# Patient Record
Sex: Female | Born: 1990
Health system: Southern US, Community
[De-identification: ages and names within clinical notes are randomized; demographics above are authoritative.]

## PROBLEM LIST (undated history)

## (undated) ENCOUNTER — Inpatient Hospital Stay (HOSPITAL_COMMUNITY): Payer: Self-pay

## (undated) DIAGNOSIS — F419 Anxiety disorder, unspecified: Secondary | ICD-10-CM

## (undated) DIAGNOSIS — R87629 Unspecified abnormal cytological findings in specimens from vagina: Secondary | ICD-10-CM

## (undated) DIAGNOSIS — N83209 Unspecified ovarian cyst, unspecified side: Secondary | ICD-10-CM

## (undated) DIAGNOSIS — A749 Chlamydial infection, unspecified: Secondary | ICD-10-CM

## (undated) DIAGNOSIS — K219 Gastro-esophageal reflux disease without esophagitis: Secondary | ICD-10-CM

## (undated) SURGERY — Surgical Case
Anesthesia: *Unknown

---
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
## 2009-03-29 ENCOUNTER — Emergency Department (HOSPITAL_COMMUNITY): Admission: EM | Admit: 2009-03-29 | Discharge: 2009-03-29 | Payer: Self-pay | Admitting: Emergency Medicine

## 2009-07-31 ENCOUNTER — Emergency Department (HOSPITAL_COMMUNITY): Admission: EM | Admit: 2009-07-31 | Discharge: 2009-07-31 | Payer: Self-pay | Admitting: Emergency Medicine

## 2010-05-02 ENCOUNTER — Emergency Department (HOSPITAL_COMMUNITY)
Admission: EM | Admit: 2010-05-02 | Discharge: 2010-05-03 | Payer: Self-pay | Source: Home / Self Care | Admitting: Emergency Medicine

## 2010-08-15 LAB — POCT PREGNANCY, URINE: Preg Test, Ur: NEGATIVE

## 2010-08-23 LAB — URINE MICROSCOPIC-ADD ON

## 2010-08-23 LAB — URINALYSIS, ROUTINE W REFLEX MICROSCOPIC
Ketones, ur: NEGATIVE mg/dL
Nitrite: NEGATIVE
Specific Gravity, Urine: 1.027 (ref 1.005–1.030)
Urobilinogen, UA: 1 mg/dL (ref 0.0–1.0)
pH: 5.5 (ref 5.0–8.0)

## 2010-08-23 LAB — DIFFERENTIAL
Basophils Relative: 0 % (ref 0–1)
Eosinophils Absolute: 0.1 10*3/uL (ref 0.0–0.7)
Eosinophils Relative: 1 % (ref 0–5)
Lymphs Abs: 0.9 10*3/uL (ref 0.7–4.0)
Monocytes Absolute: 0.3 10*3/uL (ref 0.1–1.0)
Monocytes Relative: 6 % (ref 3–12)

## 2010-08-23 LAB — CBC
HCT: 37.5 % (ref 36.0–46.0)
MCHC: 33.6 g/dL (ref 30.0–36.0)
MCV: 89.2 fL (ref 78.0–100.0)
RBC: 4.2 MIL/uL (ref 3.87–5.11)
WBC: 6.1 10*3/uL (ref 4.0–10.5)

## 2010-09-07 LAB — URINALYSIS, ROUTINE W REFLEX MICROSCOPIC
Bilirubin Urine: NEGATIVE
Glucose, UA: NEGATIVE mg/dL
Ketones, ur: NEGATIVE mg/dL
Protein, ur: NEGATIVE mg/dL
pH: 6.5 (ref 5.0–8.0)

## 2010-09-07 LAB — URINE CULTURE

## 2010-09-07 LAB — WET PREP, GENITAL: Yeast Wet Prep HPF POC: NONE SEEN

## 2010-09-07 LAB — URINE MICROSCOPIC-ADD ON

## 2010-09-07 LAB — POCT PREGNANCY, URINE: Preg Test, Ur: NEGATIVE

## 2010-09-12 LAB — URINE MICROSCOPIC-ADD ON

## 2010-09-12 LAB — POCT PREGNANCY, URINE: Preg Test, Ur: NEGATIVE

## 2010-09-12 LAB — URINALYSIS, ROUTINE W REFLEX MICROSCOPIC
Ketones, ur: 15 mg/dL — AB
Nitrite: NEGATIVE
Protein, ur: 30 mg/dL — AB
pH: 7 (ref 5.0–8.0)

## 2010-12-15 ENCOUNTER — Emergency Department (HOSPITAL_COMMUNITY): Payer: Self-pay

## 2010-12-15 ENCOUNTER — Emergency Department (HOSPITAL_COMMUNITY)
Admission: EM | Admit: 2010-12-15 | Discharge: 2010-12-15 | Disposition: A | Discharge status: Home / Self Care | Payer: Self-pay | Attending: Emergency Medicine | Admitting: Emergency Medicine

## 2010-12-15 DIAGNOSIS — R1013 Epigastric pain: Secondary | ICD-10-CM | POA: Insufficient documentation

## 2010-12-15 DIAGNOSIS — K5900 Constipation, unspecified: Secondary | ICD-10-CM | POA: Insufficient documentation

## 2010-12-15 DIAGNOSIS — K219 Gastro-esophageal reflux disease without esophagitis: Secondary | ICD-10-CM | POA: Insufficient documentation

## 2010-12-15 LAB — LIPASE, BLOOD: Lipase: 39 U/L (ref 11–59)

## 2010-12-15 LAB — URINALYSIS, ROUTINE W REFLEX MICROSCOPIC
Bilirubin Urine: NEGATIVE
Glucose, UA: NEGATIVE mg/dL
Hgb urine dipstick: NEGATIVE
Ketones, ur: NEGATIVE mg/dL
Nitrite: NEGATIVE
Protein, ur: NEGATIVE mg/dL
Specific Gravity, Urine: 1.008 (ref 1.005–1.030)
Urobilinogen, UA: 1 mg/dL (ref 0.0–1.0)
pH: 6 (ref 5.0–8.0)

## 2010-12-15 LAB — COMPREHENSIVE METABOLIC PANEL WITH GFR
ALT: 8 U/L (ref 0–35)
AST: 17 U/L (ref 0–37)
Albumin: 3.9 g/dL (ref 3.5–5.2)
Alkaline Phosphatase: 36 U/L — ABNORMAL LOW (ref 39–117)
CO2: 26 meq/L (ref 19–32)
Chloride: 103 meq/L (ref 96–112)
GFR calc non Af Amer: >60 mL/min (ref >60)
Potassium: 4 meq/L (ref 3.5–5.1)
Sodium: 135 meq/L (ref 135–145)
Total Bilirubin: 0.3 mg/dL (ref 0.3–1.2)

## 2010-12-15 LAB — URINE MICROSCOPIC-ADD ON

## 2010-12-15 LAB — COMPREHENSIVE METABOLIC PANEL
BUN: 7 mg/dL (ref 6–23)
Calcium: 9.8 mg/dL (ref 8.4–10.5)
Creatinine, Ser: 0.56 mg/dL (ref 0.50–1.10)
GFR calc Af Amer: >60 mL/min (ref >60)
Glucose, Bld: 81 mg/dL (ref 70–99)
Total Protein: 7.8 g/dL (ref 6.0–8.3)

## 2010-12-15 LAB — POCT PREGNANCY, URINE: Preg Test, Ur: NEGATIVE

## 2010-12-17 LAB — URINE CULTURE
Colony Count: NO GROWTH
Culture  Setup Time: 201207140046
Culture: NO GROWTH

## 2011-02-27 LAB — URINE MICROSCOPIC-ADD ON

## 2011-02-27 LAB — URINALYSIS, ROUTINE W REFLEX MICROSCOPIC
Glucose, UA: NEGATIVE
Hgb urine dipstick: NEGATIVE
Ketones, ur: NEGATIVE
Protein, ur: NEGATIVE

## 2011-03-01 LAB — WET PREP, GENITAL
Trich, Wet Prep: NONE SEEN
Yeast Wet Prep HPF POC: NONE SEEN

## 2011-03-01 LAB — URINE MICROSCOPIC-ADD ON

## 2011-03-01 LAB — URINALYSIS, ROUTINE W REFLEX MICROSCOPIC
Glucose, UA: NEGATIVE
Ketones, ur: 15 — AB
Protein, ur: NEGATIVE

## 2011-03-01 LAB — POCT PREGNANCY, URINE: Operator id: 29452

## 2011-03-05 LAB — URINALYSIS, ROUTINE W REFLEX MICROSCOPIC
Glucose, UA: NEGATIVE
Hgb urine dipstick: NEGATIVE
Ketones, ur: NEGATIVE
Nitrite: NEGATIVE
pH: 7

## 2011-03-05 LAB — CBC
HCT: 31 — ABNORMAL LOW
Hemoglobin: 10.3 — ABNORMAL LOW
RBC: 3.64 — ABNORMAL LOW
WBC: 5.4

## 2011-03-05 LAB — COMPREHENSIVE METABOLIC PANEL
Alkaline Phosphatase: 51
BUN: 4 — ABNORMAL LOW
CO2: 26
Chloride: 105
Glucose, Bld: 90
Potassium: 3.6
Total Bilirubin: 0.4

## 2011-03-05 LAB — DIFFERENTIAL
Basophils Absolute: 0
Basophils Relative: 0
Eosinophils Absolute: 0.1
Neutro Abs: 4.2
Neutrophils Relative %: 77

## 2011-03-05 LAB — POCT PREGNANCY, URINE: Preg Test, Ur: NEGATIVE

## 2011-03-05 LAB — GC/CHLAMYDIA PROBE AMP, GENITAL
Chlamydia, DNA Probe: POSITIVE — AB
GC Probe Amp, Genital: POSITIVE — AB

## 2011-03-05 LAB — URINE MICROSCOPIC-ADD ON

## 2011-03-05 LAB — WET PREP, GENITAL

## 2011-08-22 ENCOUNTER — Emergency Department (HOSPITAL_COMMUNITY): Payer: Self-pay

## 2011-08-22 ENCOUNTER — Encounter (HOSPITAL_COMMUNITY): Payer: Self-pay | Admitting: *Deleted

## 2011-08-22 ENCOUNTER — Emergency Department (HOSPITAL_COMMUNITY)
Admission: EM | Admit: 2011-08-22 | Discharge: 2011-08-22 | Disposition: A | Discharge status: Home / Self Care | Payer: Self-pay | Attending: Emergency Medicine | Admitting: Emergency Medicine

## 2011-08-22 ENCOUNTER — Other Ambulatory Visit: Payer: Self-pay

## 2011-08-22 DIAGNOSIS — R42 Dizziness and giddiness: Secondary | ICD-10-CM | POA: Insufficient documentation

## 2011-08-22 DIAGNOSIS — R0609 Other forms of dyspnea: Secondary | ICD-10-CM | POA: Insufficient documentation

## 2011-08-22 DIAGNOSIS — R0602 Shortness of breath: Secondary | ICD-10-CM | POA: Insufficient documentation

## 2011-08-22 DIAGNOSIS — R000 Tachycardia, unspecified: Secondary | ICD-10-CM | POA: Insufficient documentation

## 2011-08-22 DIAGNOSIS — R002 Palpitations: Secondary | ICD-10-CM | POA: Insufficient documentation

## 2011-08-22 DIAGNOSIS — R55 Syncope and collapse: Secondary | ICD-10-CM | POA: Insufficient documentation

## 2011-08-22 DIAGNOSIS — R0989 Other specified symptoms and signs involving the circulatory and respiratory systems: Secondary | ICD-10-CM | POA: Insufficient documentation

## 2011-08-22 DIAGNOSIS — R6883 Chills (without fever): Secondary | ICD-10-CM | POA: Insufficient documentation

## 2011-08-22 HISTORY — DX: Gastro-esophageal reflux disease without esophagitis: K21.9

## 2011-08-22 LAB — URINALYSIS, ROUTINE W REFLEX MICROSCOPIC
Bilirubin Urine: NEGATIVE
Nitrite: NEGATIVE
Specific Gravity, Urine: 1.017 (ref 1.005–1.030)
pH: 7.5 (ref 5.0–8.0)

## 2011-08-22 LAB — POCT PREGNANCY, URINE: Preg Test, Ur: NEGATIVE

## 2011-08-22 NOTE — ED Notes (Signed)
Pt states she could be pregnant but is unsure

## 2011-08-22 NOTE — ED Notes (Signed)
Pt states she is sob at times.pt denies any chest pain.pt states about week ago she had an anxiety attack at work.pt states she has never had an anxiety attack. Pt states she just wants to get checked out
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
## 2011-08-22 NOTE — Discharge Instructions (Signed)
Follow up with the cardiologist for further evaluation of palpitations.  You will likely need to wear a holter monitor which gives you around the clock monitoring of your heart rhythm for an extended period of time.  Your EKG, chest xray, blood count, blood sugar and electrolytes are all normal today.  You should return to the ER if you pass out in the setting of having a racing heart.  Cardiac Event Monitoring A cardiac event monitor is a small recording device used to help detect abnormal heart rhythms. When the heart does not beat in a stable or regular pattern, this is called an arrhythmia. An arrhythmia may or may not be felt. Palpitations, or fast heart beats, may be felt. A cardiac event monitor is used to record the heart rhythm when noticeable symptoms such as the following occur:  Palpitations, such as heart racing or fluttering.   Dizziness.   Fainting or lightheadedness.   Unexplained weakness.  The monitor is wired to two electrodes placed on your chest. Electrodes are flat sticky disks that attach to your skin. The monitor stores the heart rhythm and can be worn for up to 30 days. You will wear the monitor at all times, except when bathing. When you feel symptoms of heart trouble, such as dizziness, weakness, lightheadedness, palpitations, "thumping," shortness of breath or a fluttering or racing heart, you will push a button on the monitor to record the heart rhythm.  Your caregiver will also ask you to keep a diary of your activities, such as walking, doing chores and taking medicine. Be sure to note what you are doing when you experience heart symptoms. This will help your caregiver determine what might be contributing to your symptoms. The information stored in your monitor will be reviewed by your caregiver alongside your diary entries.  LET YOUR CAREGIVER KNOW ABOUT: Allergies to any kind of medical tape. PROCEDURE  A technician will prepare your chest for the electrode placement.  The technician will show you how to place the electrodes, how to work the monitor, how to replace the batteries and how to fill in your diary. Take time to practice using the monitor before you leave the office. Make sure you understand how to send the information from the monitor to your doctor. This requires a telephone with a land line, not a cellphone.   If you start to feel lightheaded, dizzy, weak or have fluttering or racing sensations in your heart, press the record button. The monitor is always on and records what happened slightly before you pressed the button, so do not worry about being too late to get good information.  HOME CARE INSTRUCTIONS   Wear your monitor at all times, except when you are in water:   Do not get the monitor wet.   Take the monitor off when bathing, do not swim or use a hot tub with it on.   Keep your skin clean, do not put body lotion or moisturizer on your chest.   It's possible that your skin under the electrodes could become irritated. To keep this from happening, try to put the electrodes in slightly different places on your chest. However, they must remain in the area under your left breast and in the upper right section of your chest.   Change the electrodes daily or any time they stop sticking to your skin. You might need to use tape to keep them on.   Make sure the monitor is safely clipped to your clothing  or in a location close to your body that your caregiver recommends.   Keep a diary of your activities, especially what you were doing when you pushed the button to record your heart symptoms.   Change the batteries as recommended by your caregiver.   Send the recorded information as recommended by your caregiver. It is important to understand that your caregiver does not have instantaneous results when a recording is made.  SEEK MEDICAL CARE IF:   Your doctor reviews your records and advises you to come to the office or go to the ER.   You  have questions or concerns about the cardiac event monitor.  SEEK IMMEDIATE MEDICAL CARE IF:   You have chest pain.   You have difficulty breathing or shortness of breath.   You develop a very fast heartbeat or your heart "skips" beats.   You develop dizziness which lasts several minutes.   You faint or feel you are about to faint.  MAKE SURE YOU:   Understand these instructions.   Will watch your condition.   Will get help right away if you are not doing well or get worse.  Document Released: 02/28/2008 Document Revised: 05/10/2011 Document Reviewed: 02/28/2008 [facility] Patient Information 2012 [place], [place].Palpitations  A palpitation is the feeling that your heartbeat is irregular or is faster than normal. Although this is frightening, it usually is not serious. Palpitations may be caused by excesses of smoking, caffeine, or alcohol. They are also brought on by stress and anxiety. Sometimes, they are caused by heart disease. Unless otherwise noted, your caregiver did not find any signs of serious illness at this time. HOME CARE INSTRUCTIONS  To help prevent palpitations:  Drink decaffeinated coffee, tea, and soda pop. Avoid chocolate.   If you smoke or drink alcohol, quit or cut down as much as possible.   Reduce your stress or anxiety level. Biofeedback, yoga, or meditation will help you relax. Physical activity such as swimming, jogging, or walking also may be helpful.  SEEK MEDICAL CARE IF:   You continue to have a fast heartbeat.   Your palpitations occur more often.  SEEK IMMEDIATE MEDICAL CARE IF: You develop chest pain, shortness of breath, severe headache, dizziness, or fainting. Document Released: 05/18/2000 Document Revised: 05/10/2011 Document Reviewed: 07/18/2007 [facility] Patient Information 2012 [place], [place].

## 2011-08-22 NOTE — ED Notes (Signed)
Pt reports tingling and numbness in bila hands x 1 week now.  Pt reports SOB and palpitations intermittently.  When asked if she was hyperventilating-pt states "well my heart my breathing fast so that made me breathe fast."  Pt also reports getting dizzy and "seeing spots" at times all of a sudden, last episode was while she was working.  Pt is requesting to be checked for anemia.  Pt reports sxs are resolved at this time.

## 2011-08-24 LAB — POCT I-STAT, CHEM 8
BUN: 6 mg/dL (ref 6–23)
HCT: 42 % (ref 36.0–46.0)
Hemoglobin: 14.3 g/dL (ref 12.0–15.0)
Sodium: 141 mEq/L (ref 135–145)
TCO2: 23 mmol/L (ref 0–100)

## 2011-08-27 NOTE — ED Provider Notes (Signed)
Medical screening examination/treatment/procedure(s) were performed by non-physician practitioner and as supervising physician I was immediately available for consultation/collaboration.   Ornella Coderre, MD 08/27/11 1732 

## 2011-09-13 ENCOUNTER — Emergency Department (INDEPENDENT_AMBULATORY_CARE_PROVIDER_SITE_OTHER)
Admission: EM | Admit: 2011-09-13 | Discharge: 2011-09-13 | Disposition: A | Discharge status: Home Health | Payer: Self-pay | Source: Home / Self Care | Attending: Emergency Medicine | Admitting: Emergency Medicine

## 2011-09-13 ENCOUNTER — Encounter (HOSPITAL_COMMUNITY): Payer: Self-pay

## 2011-09-13 DIAGNOSIS — F419 Anxiety disorder, unspecified: Secondary | ICD-10-CM

## 2011-09-13 DIAGNOSIS — F411 Generalized anxiety disorder: Secondary | ICD-10-CM

## 2011-09-13 MED ORDER — CLONAZEPAM 0.5 MG PO TABS: 0.5000 mg | ORAL_TABLET | Freq: Two times a day (BID) | ORAL | Status: DC | PRN

## 2011-09-13 MED ORDER — FLUOXETINE HCL 20 MG PO TABS: 20.0000 mg | ORAL_TABLET | Freq: Every day | ORAL | Status: DC
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
## 2011-09-13 NOTE — ED Notes (Signed)
Pt c/o SOB for past 3 days.  Pt denies cough, fever.  Breath sounds clear to auscultation bilaterally.

## 2011-09-13 NOTE — Discharge Instructions (Signed)

## 2011-09-21 ENCOUNTER — Emergency Department (HOSPITAL_COMMUNITY)
Admission: EM | Admit: 2011-09-21 | Discharge: 2011-09-21 | Disposition: A | Discharge status: Home / Self Care | Payer: Self-pay | Attending: Emergency Medicine | Admitting: Emergency Medicine

## 2011-09-21 ENCOUNTER — Emergency Department (HOSPITAL_COMMUNITY): Payer: Self-pay

## 2011-09-21 DIAGNOSIS — R450 Nervousness: Secondary | ICD-10-CM | POA: Insufficient documentation

## 2011-09-21 DIAGNOSIS — R0602 Shortness of breath: Secondary | ICD-10-CM | POA: Insufficient documentation

## 2011-09-21 DIAGNOSIS — R062 Wheezing: Secondary | ICD-10-CM | POA: Insufficient documentation

## 2011-09-21 MED ORDER — ALBUTEROL SULFATE HFA 108 (90 BASE) MCG/ACT IN AERS
2.0000 | INHALATION_SPRAY | RESPIRATORY_TRACT | Status: DC
  Filled 2011-09-21: qty 6.7

## 2011-09-21 NOTE — Discharge Instructions (Signed)
Your ECG and chest x-ray are normal today. Take zyrtec for sortness of breath, this is sold over the counter. You can take generic version of it. Take albuterol 2 puffs every 4 hrs. Follow up with a primary car doctor.   RESOURCE GUIDE  Dental Problems  Patients with Medicaid: [facility] [phone]                                           [phone] [address] Phone:  [phone]                                                  Phone:  [phone]  If unable to pay or uninsured, contact:  [facility] or [facility]. to become qualified for the adult dental clinic.  Chronic Pain Problems Contact [facility]  [phone] Patients need to be referred by their primary care doctor.  Insufficient Money for Medicine Contact [employer] Way:  call "211" or [facility] ([phone])451-5110.  No Primary Care Doctor Call [facility]  [phone] Other agencies that provide inexpensive medical care    [facility]  [phone]    [facility]  [phone]    [facility]  [phone]    [facility]  [phone]    [facility]  6502552424    [facility]  [phone]  Psychological Services [facility]  [phone] [facility] Services  [phone] [facility]   [phone] (emergency services [phone])  Substance Abuse Resources [facility]  [phone] [facility] ([phone] [facility] [phone])433-6249 Floydene Flock [phone] Residential & Outpatient Substance Abuse Program  4170522158  Abuse/Neglect [facility] Child Abuse Hotline (865)015-7182 [facility] Child Abuse Hotline (541) 821-0767 (After Hours)  Emergency Shelter [facility] [phone]  Maternity Homes [facility] [place] [facility] [place] [phone] [facility] (228)050-3417  MRSA Hotline #:    (321)780-3535    [facility] Resources  [facility]     [employer]                          [facility] Dept. [address] [place]                       [address]      [address]  [place]                                                Cristobal Goldmann Phone:  [phone]  Phone:  [phone]                 Phone:  [phone]  [place] [facility] Phone:  [phone]  [place] Child Abuse Hotline (336) 342-1394 [phone]) [phone] (After Hours)   

## 2011-09-21 NOTE — ED Notes (Signed)
Sob since yesterday; stated, "wheezy." sob worsened throughout the night. No chest wall pain. Went to [facility] yesterday for evaluation. Family member with pan.

## 2011-09-21 NOTE — ED Provider Notes (Signed)
History     CSN: [id number]  Arrival date & time 09/21/11  0805   First MD Initiated Contact with Patient 09/21/11 [phone]      Chief Complaint  Patient presents with  . Shortness of Breath    (Consider location/radiation/quality/duration/timing/severity/associated sxs/prior treatment) Patient is a 21 y.o. female presenting with shortness of breath. The history is provided by the patient.  Shortness of Breath  The current episode started yesterday. The problem occurs continuously. The problem has been unchanged. The problem is moderate. The symptoms are relieved by nothing. Associated symptoms include shortness of breath and wheezing. Pertinent negatives include no chest pain, no fever, no sore throat and no cough.  Pt states since yesterday, she feels like she cant get enough air in. States this is different from palpitations and chest pain that she has been here with before. States she is not having any cough, chest pain. States she feels like she is wheezing. Denies any recent travel, not on birth control. No lower extremity swelling. Not pregnant.  No other complaints  Past Medical History  Diagnosis Date  . GERD (gastroesophageal reflux disease)     No past surgical history on file.  No family history on file.  History  Substance Use Topics  . Smoking status: Never Smoker   . Smokeless tobacco: Not on file  . Alcohol Use: No    OB History    Grav Para Term Preterm Abortions TAB SAB Ect Mult Living                  Review of Systems  Constitutional: Negative for fever and chills.  HENT: Negative for congestion and sore throat.   Eyes: Negative for itching.  Respiratory: Positive for shortness of breath and wheezing. Negative for cough and chest tightness.   Cardiovascular: Negative for chest pain, palpitations and leg swelling.  Gastrointestinal: Negative.   Genitourinary: Negative.   Musculoskeletal: Negative.   Skin: Negative.   Neurological: Negative for  dizziness and weakness.  Psychiatric/Behavioral: The patient is nervous/anxious.     Allergies  Review of patient's allergies indicates no known allergies.  Home Medications   Current Outpatient Rx  Name Route Sig Dispense Refill  . CLONAZEPAM 0.5 MG PO TABS Oral Take 0.5 mg by mouth 2 (two) times daily as needed. For anxiety    . FLUOXETINE HCL 20 MG PO TABS Oral Take 20 mg by mouth daily.      BP 117/66  Pulse 81  Temp(Src) 98.4 F (36.9 C) (Oral)  SpO2 99%  LMP 09/10/2011  Physical Exam  Nursing note and vitals reviewed. Constitutional: She is oriented to person, place, and time. She appears well-developed and well-nourished. No distress.  HENT:  Head: Normocephalic.  Eyes: Conjunctivae are normal.  Neck: Neck supple.  Cardiovascular: Normal rate, regular rhythm and normal heart sounds.   Pulmonary/Chest: Effort normal and breath sounds normal. No respiratory distress. She has no wheezes. She has no rales. She exhibits no tenderness.  Abdominal: Soft. Bowel sounds are normal. She exhibits no distension. There is no tenderness.  Musculoskeletal: Normal range of motion. She exhibits no edema.  Neurological: She is alert and oriented to person, place, and time.  Skin: Skin is warm and dry.  Psychiatric: She has a normal mood and affect.    ED Course  Procedures (including critical care time)  Pt with SOB, no chest pain. No fever, cough. Vitals normal. PERC negative, doubt PE. Low risk for ACS. ECG normal. Will get  CXR. Pt feels like she is wheezing. Will try albuterol.    Dg Chest 2 View  09/21/2011  *RADIOLOGY REPORT*  Clinical Data: Shortness of breath  CHEST - 2 VIEW  Comparison: 08/22/2011  Findings: Cardiomediastinal silhouette is stable.  No acute infiltrate or pleural effusion.  No pulmonary edema.  Bony thorax is stable.  IMPRESSION: No active disease.  No significant change.  Original Report Authenticated By: Natasha Mead, M.D.    Date: 09/21/2011  Rate: 75   Rhythm: normal sinus rhythm  QRS Axis: normal  Intervals: normal  ST/T Wave abnormalities: normal  Conduction Disutrbances: none  Narrative Interpretation:   Old EKG Reviewed: No significant changes noted  Pt feeling better with albuterol. CXR negative. ECG normal. Will d/c home with outpatient follow up. I suspect there is some anxiety associated with pt's symptoms.        No diagnosis found.    MDM          Lottie Mussel, PA 09/21/11 1008

## 2011-09-21 NOTE — Progress Notes (Signed)
Met with patient to discuss health care options. She does not have a PCP or health insurance. Offered [facility] program information and patient agreed. She should be contacting community liaison and pt information has been left for liaison.

## 2011-09-28 NOTE — ED Provider Notes (Signed)
Medical screening examination/treatment/procedure(s) were performed by non-physician practitioner and as supervising physician I was immediately available for consultation/collaboration.  Doug Sou, MD 09/28/11 [phone]

## 2011-10-04 ENCOUNTER — Emergency Department (HOSPITAL_COMMUNITY)
Admission: EM | Admit: 2011-10-04 | Discharge: 2011-10-04 | Disposition: A | Discharge status: Home / Self Care | Payer: Self-pay | Attending: Emergency Medicine | Admitting: Emergency Medicine

## 2011-10-04 ENCOUNTER — Encounter (HOSPITAL_COMMUNITY): Payer: Self-pay | Admitting: *Deleted

## 2011-10-04 DIAGNOSIS — R05 Cough: Secondary | ICD-10-CM | POA: Insufficient documentation

## 2011-10-04 DIAGNOSIS — R079 Chest pain, unspecified: Secondary | ICD-10-CM | POA: Insufficient documentation

## 2011-10-04 DIAGNOSIS — K219 Gastro-esophageal reflux disease without esophagitis: Secondary | ICD-10-CM | POA: Insufficient documentation

## 2011-10-04 DIAGNOSIS — K5900 Constipation, unspecified: Secondary | ICD-10-CM | POA: Insufficient documentation

## 2011-10-04 DIAGNOSIS — R1013 Epigastric pain: Secondary | ICD-10-CM | POA: Insufficient documentation

## 2011-10-04 DIAGNOSIS — R0602 Shortness of breath: Secondary | ICD-10-CM | POA: Insufficient documentation

## 2011-10-04 DIAGNOSIS — R059 Cough, unspecified: Secondary | ICD-10-CM | POA: Insufficient documentation

## 2011-10-04 LAB — D-DIMER, QUANTITATIVE: D-Dimer, Quant: <0.22 ug/mL-FEU (ref 0.00–0.48)

## 2011-10-04 LAB — DIFFERENTIAL
Lymphocytes Relative: 32 % (ref 12–46)
Lymphs Abs: 1 10*3/uL (ref 0.7–4.0)
Neutro Abs: 1.8 10*3/uL (ref 1.7–7.7)
Neutrophils Relative %: 56 % (ref 43–77)

## 2011-10-04 LAB — BASIC METABOLIC PANEL
CO2: 24 mEq/L (ref 19–32)
Glucose, Bld: 84 mg/dL (ref 70–99)
Potassium: 3.6 mEq/L (ref 3.5–5.1)
Sodium: 135 mEq/L (ref 135–145)

## 2011-10-04 LAB — URINALYSIS, ROUTINE W REFLEX MICROSCOPIC
Glucose, UA: NEGATIVE mg/dL
Leukocytes, UA: NEGATIVE
Nitrite: NEGATIVE
Protein, ur: NEGATIVE mg/dL
pH: 6 (ref 5.0–8.0)

## 2011-10-04 LAB — CBC
Platelets: 259 10*3/uL (ref 150–400)
RBC: 4.5 MIL/uL (ref 3.87–5.11)
WBC: 3.3 10*3/uL — ABNORMAL LOW (ref 4.0–10.5)

## 2011-10-04 LAB — LIPASE, BLOOD: Lipase: 29 U/L (ref 11–59)

## 2011-10-04 NOTE — ED Provider Notes (Signed)
History     CSN: [id number]  Arrival date & time 10/04/11  2440   First MD Initiated Contact with Patient 10/04/11 [phone]      Chief Complaint  Patient presents with  . Chest Pain  . Shortness of Breath  . Abdominal Pain  . Cough    Patient is a 21 y.o. female presenting with chest pain, shortness of breath, abdominal pain, and cough. The history is provided by the patient.  Chest Pain Episode onset: It started about two months ago off and on.  This episode started 4 days ago. Primary symptoms include shortness of breath, cough and abdominal pain. Pertinent negatives for primary symptoms include no fever.  Episode onset: It started about two months ago off and on.  This episode started 4 days ago. The shortness of breath developed gradually. The shortness of breath is moderate. The patient's medical history does not include asthma.    Shortness of Breath  Associated symptoms include chest pain, cough and shortness of breath. Pertinent negatives include no fever. Her past medical history does not include asthma.  Abdominal Pain The primary symptoms of the illness include abdominal pain and shortness of breath. The primary symptoms of the illness do not include fever or dysuria.  The patient's medical history does not include asthma.  Additional symptoms associated with the illness include constipation.  Cough Associated symptoms include chest pain and shortness of breath. Her past medical history does not include asthma.  Pt states the pain in her chest is sharp in nature and brief.  She also has some discomfort in her epigastric region.  Pt has been seen several times in the past. She's been seen in the emergency room in the [facility]. Patient has had a negative evaluation each time. Is felt that her symptoms could be related to anxiety. She was started on clonazepam and fluoxetine. Patient was instructed to follow up with a primary care doctor but states she has not tried to  do that yet. She was given referrals to help serve previously No recent trips or travel. Past Medical History  Diagnosis Date  . GERD (gastroesophageal reflux disease)     History reviewed. No pertinent past surgical history.  No family history on file. NO CAD or PE  History  Substance Use Topics  . Smoking status: Never Smoker   . Smokeless tobacco: Not on file  . Alcohol Use: No    OB History    Grav Para Term Preterm Abortions TAB SAB Ect Mult Living                  Review of Systems  Constitutional: Negative for fever.  Respiratory: Positive for cough and shortness of breath.   Cardiovascular: Positive for chest pain.  Gastrointestinal: Positive for abdominal pain and constipation.  Genitourinary: Negative for dysuria.  Musculoskeletal: Negative for joint swelling.  All other systems reviewed and are negative.    Allergies  Review of patient's allergies indicates no known allergies.  Home Medications   Current Outpatient Rx  Name Route Sig Dispense Refill  . CLONAZEPAM 0.5 MG PO TABS Oral Take 0.5 mg by mouth 2 (two) times daily as needed. For anxiety    . FLUOXETINE HCL 20 MG PO TABS Oral Take 20 mg by mouth daily.      BP 112/72  Pulse 78  Temp(Src) 98.7 F (37.1 C) (Oral)  Resp 18  SpO2 100%  LMP 09/24/2011  Physical Exam  Nursing note  and vitals reviewed. Constitutional: She appears well-developed and well-nourished. No distress.  HENT:  Head: Normocephalic and atraumatic.  Right Ear: External ear normal.  Left Ear: External ear normal.  Eyes: Conjunctivae are normal. Right eye exhibits no discharge. Left eye exhibits no discharge. No scleral icterus.  Neck: Neck supple. No tracheal deviation present.  Cardiovascular: Normal rate, regular rhythm and intact distal pulses.   Pulmonary/Chest: Effort normal and breath sounds normal. No stridor. No respiratory distress. She has no wheezes. She has no rales.  Abdominal: Soft. Bowel sounds are  normal. She exhibits no distension. There is no tenderness. There is no rebound and no guarding.  Musculoskeletal: She exhibits no edema and no tenderness.  Neurological: She is alert. She has normal strength. No sensory deficit. Cranial nerve deficit:  no gross defecits noted. She exhibits normal muscle tone. She displays no seizure activity. Coordination normal.  Skin: Skin is warm and dry. No rash noted.  Psychiatric: She has a normal mood and affect.    ED Course  Procedures (including critical care time)  Rate: 77  Rhythm: normal sinus rhythm  QRS Axis: normal  Intervals: normal  ST/T Wave abnormalities: normal  Conduction Disutrbances:none  Narrative Interpretation: nl  Old EKG Reviewed: no changes  Labs Reviewed  CBC - Abnormal; Notable for the following:    WBC 3.3 (*)    All other components within normal limits  BASIC METABOLIC PANEL - Abnormal; Notable for the following:    BUN 5 (*)    All other components within normal limits  URINALYSIS, ROUTINE W REFLEX MICROSCOPIC - Abnormal; Notable for the following:    Ketones, ur TRACE (*)    All other components within normal limits  DIFFERENTIAL  LIPASE, BLOOD  D-DIMER, QUANTITATIVE   Previous Chest X-rays have been reviewed.  No acute abnormalities.  MDM  Pt without acute abnormalities on her ED evaluation today.  Mild hypokalemia noted and treated.  I doubt cardiac etiology, PE.  No wheezing or crackles on exam to suggest bronchospasm or pneumonia.  At this time I doubt acute emergency medical condition. Think the patient would benefit from outpatient follow up with her primary care Dr. It is possible that the symptoms could be related to anxiety. Patient has been previously instructed to followup with primary care Dr. I have encouraged her to follow up on that prior recommendation.        Celene Kras, MD 10/04/11 1016

## 2011-10-04 NOTE — Discharge Instructions (Signed)
Chest Pain (Nonspecific) It is often hard to give a specific diagnosis for the cause of chest pain. There is always a chance that your pain could be related to something serious, such as a heart attack or a blood clot in the lungs. You need to follow up with your caregiver for further evaluation. CAUSES   Heartburn.   Pneumonia or bronchitis.   Anxiety or stress.   Inflammation around your heart (pericarditis) or lung (pleuritis or pleurisy).   A blood clot in the lung.   A collapsed lung (pneumothorax). It can develop suddenly on its own (spontaneous pneumothorax) or from injury (trauma) to the chest.   Shingles infection (herpes zoster virus).  The chest wall is composed of bones, muscles, and cartilage. Any of these can be the source of the pain.  The bones can be bruised by injury.   The muscles or cartilage can be strained by coughing or overwork.   The cartilage can be affected by inflammation and become sore (costochondritis).  DIAGNOSIS  Lab tests or other studies, such as X-rays, electrocardiography, stress testing, or cardiac imaging, may be needed to find the cause of your pain.  TREATMENT   Treatment depends on what may be causing your chest pain. Treatment may include:   Acid blockers for heartburn.   Anti-inflammatory medicine.   Pain medicine for inflammatory conditions.   Antibiotics if an infection is present.   You may be advised to change lifestyle habits. This includes stopping smoking and avoiding alcohol, caffeine, and chocolate.   You may be advised to keep your head raised (elevated) when sleeping. This reduces the chance of acid going backward from your stomach into your esophagus.   Most of the time, nonspecific chest pain will improve within 2 to 3 days with rest and mild pain medicine.  HOME CARE INSTRUCTIONS   If antibiotics were prescribed, take your antibiotics as directed. Finish them even if you start to feel better.   For the next few  days, avoid physical activities that bring on chest pain. Continue physical activities as directed.   Do not smoke.   Avoid drinking alcohol.   Only take over-the-counter or prescription medicine for pain, discomfort, or fever as directed by your caregiver.   Follow your caregiver's suggestions for further testing if your chest pain does not go away.   Keep any follow-up appointments you made. If you do not go to an appointment, you could develop lasting (chronic) problems with pain. If there is any problem keeping an appointment, you must call to reschedule.  SEEK MEDICAL CARE IF:   You think you are having problems from the medicine you are taking. Read your medicine instructions carefully.   Your chest pain does not go away, even after treatment.   You develop a rash with blisters on your chest.  SEEK IMMEDIATE MEDICAL CARE IF:   You have increased chest pain or pain that spreads to your arm, neck, jaw, back, or abdomen.   You develop shortness of breath, an increasing cough, or you are coughing up blood.   You have severe back or abdominal pain, feel nauseous, or vomit.   You develop severe weakness, fainting, or chills.   You have a fever.  THIS IS AN EMERGENCY. Do not wait to see if the pain will go away. Get medical help at once. Call your local emergency services (911 in [place] Do not drive yourself to the hospital. MAKE SURE YOU:   Understand these instructions.     Will watch your condition.   Will get help right away if you are not doing well or get worse.  Document Released: 02/28/2005 Document Revised: 05/10/2011 Document Reviewed: 12/25/2007 [facility] Patient Information 2012 [place], [place].   RESOURCE GUIDE Also try calling the [facility] Call for an appointment! [phone] 9am - 7pm MON-FRI  9am - 1pm SAT    Dental Problems  Patients with Medicaid: [facility]                     [phone] [address]                                            Phone:  [phone]                                                  If unable to pay or uninsured, contact:  [facility] or [facility]. to become qualified for the adult dental clinic.  Chronic Pain Problems Contact [facility]  [phone] Patients need to be referred by their primary care doctor.  Insufficient Money for Medicine Contact [employer] Way:  call "211" or [facility] [phone].  No Primary Care Doctor Call [facility]  [phone] Other agencies that provide inexpensive medical care    [facility]  [phone]    [facility]  [phone]    [facility]  [phone]    [facility]  [phone]    [facility]  [phone]    [facility]  [phone]  Substance Abuse Resources [facility] Drug Services  [phone] [facility] [phone] [facility] [place] [phone] Floydene Flock [phone] Residential & Outpatient Substance Abuse Program  [phone]  Psychological Services [facility] Behavioral Health  [phone] [facility]  [phone] [facility]   [phone] (emergency services [phone])  Abuse/Neglect [facility] Child Abuse Hotline (518) 283-9522 [facility] Child Abuse Hotline (956)264-9297 (After Hours)  Emergency Shelter [facility] [phone]) [phone]  Maternity Homes Room at the [place] [facility] [place] [phone] [facility] 4808348240  MRSA Hotline #:   743-775-0470    [facility] Resources  [facility]  United [employer]                           [facility] Dept. [address] [place]                     [address]         [address]  [place]                                               Cristobal Goldmann Phone:  [phone]  Phone:  [phone]                    Phone:  [phone]  [facility] Phone:  [phone]  [facility] Child Abuse Hotline (501) 249-5555 [phone] (After Hours)

## 2011-10-04 NOTE — ED Notes (Signed)
Pt reports shortness of breath and intermittent chest pain several times a day- pt reports symptoms started months ago.  Pt also reporting upper right abdominal pain that is constant.  Pt denies N/V. Pt reports constipation with last BM a few days ago.  Pt report non-productive cough.  Pt was seen at [facility] for the same symptoms. Pt was given an inhaler, prozac and klonopin.  Pt reports these medications have not helped.

## 2011-10-04 NOTE — ED Notes (Signed)
Pt. Was gown ,put on a monitor

## 2012-01-31 ENCOUNTER — Encounter (HOSPITAL_COMMUNITY): Payer: Self-pay | Admitting: *Deleted

## 2012-01-31 DIAGNOSIS — N83209 Unspecified ovarian cyst, unspecified side: Secondary | ICD-10-CM | POA: Insufficient documentation

## 2012-01-31 DIAGNOSIS — N949 Unspecified condition associated with female genital organs and menstrual cycle: Secondary | ICD-10-CM | POA: Insufficient documentation

## 2012-01-31 DIAGNOSIS — R10814 Left lower quadrant abdominal tenderness: Secondary | ICD-10-CM | POA: Insufficient documentation

## 2012-01-31 LAB — URINALYSIS, ROUTINE W REFLEX MICROSCOPIC
Bilirubin Urine: NEGATIVE
Nitrite: NEGATIVE
Protein, ur: NEGATIVE mg/dL
Specific Gravity, Urine: 1.027 (ref 1.005–1.030)
Urobilinogen, UA: 4 mg/dL — ABNORMAL HIGH (ref 0.0–1.0)

## 2012-01-31 NOTE — ED Notes (Signed)
Pt c/o LLQ abdominal pain x 2 weeks.  Denies n/v.  LBM yesterday.

## 2012-02-01 ENCOUNTER — Emergency Department (HOSPITAL_COMMUNITY): Payer: Self-pay

## 2012-02-01 ENCOUNTER — Emergency Department (HOSPITAL_COMMUNITY)
Admission: EM | Admit: 2012-02-01 | Discharge: 2012-02-01 | Disposition: A | Discharge status: Home / Self Care | Payer: Self-pay | Attending: Emergency Medicine | Admitting: Emergency Medicine

## 2012-02-01 DIAGNOSIS — R102 Pelvic and perineal pain: Secondary | ICD-10-CM

## 2012-02-01 DIAGNOSIS — N83209 Unspecified ovarian cyst, unspecified side: Secondary | ICD-10-CM

## 2012-02-01 HISTORY — DX: Anxiety disorder, unspecified: F41.9

## 2012-02-01 LAB — POCT I-STAT, CHEM 8
BUN: 11 mg/dL (ref 6–23)
Creatinine, Ser: 0.7 mg/dL (ref 0.50–1.10)
Glucose, Bld: 97 mg/dL (ref 70–99)
Hemoglobin: 13.3 g/dL (ref 12.0–15.0)
Potassium: 4.2 mEq/L (ref 3.5–5.1)
Sodium: 140 mEq/L (ref 135–145)
TCO2: 22 mmol/L (ref 0–100)

## 2012-02-01 LAB — WET PREP, GENITAL: Trich, Wet Prep: NONE SEEN

## 2012-02-01 MED ORDER — NAPROXEN 500 MG PO TABS: 500.0000 mg | ORAL_TABLET | Freq: Two times a day (BID) | ORAL | Status: AC

## 2012-02-01 NOTE — ED Notes (Signed)
PELVIC EXAM PERFORMED BY P. DAMMEN PA WITH LADY NT CHAPERONE.

## 2012-02-01 NOTE — ED Provider Notes (Signed)
Medical screening examination/treatment/procedure(s) were performed by non-physician practitioner and as supervising physician I was immediately available for consultation/collaboration.  Maze Corniel, MD 02/01/12 0736 

## 2012-02-01 NOTE — ED Notes (Signed)
TRANSPORTED TO ULTRASOUND

## 2012-02-01 NOTE — ED Provider Notes (Signed)
History     CSN: [id number]  Arrival date & time 01/31/12  2245   First MD Initiated Contact with Patient 02/01/12 0036      Chief Complaint  Patient presents with  . Abdominal Pain   HPI  History provided by the patient. Patient is a 21 year old female with no significant PMH who presents with complaints of left lower quadrant abdominal pain for the past several days. Patient states symptoms have worsened over the past 4 days and had become more persistent. Pains are waxing and waning throughout the day and may be strong at different times. Patient does not associate any specific aggravating or alleviating factors. She has attempted use over-the-counter Tylenol without improvement of symptoms. Patient has also had some irregular bowel movements and slight constipation. She did have some bowel movement yesterday. She denies any increased gas. Patient does report having some similar discomforts and pains while she was on Depo many months ago. Patient stop using Depo at the beginning of the year. Patient is not currently on any birth control. She reports normal menstrual cycle in July but does not remember the specific dates. Patient denies having any dysuria, hematuria, urinary frequency, flank pain, vaginal bleeding or vaginal discharge. She denies any fever, chills or sweats.    Past Medical History  Diagnosis Date  . GERD (gastroesophageal reflux disease)   . Anxiety     History reviewed. No pertinent past surgical history.  History reviewed. No pertinent family history.  History  Substance Use Topics  . Smoking status: Never Smoker   . Smokeless tobacco: Not on file  . Alcohol Use: Yes    OB History    Grav Para Term Preterm Abortions TAB SAB Ect Mult Living                  Review of Systems  Constitutional: Negative for fever and chills.  Gastrointestinal: Positive for abdominal pain and constipation. Negative for nausea, vomiting and diarrhea.  Genitourinary:  Negative for dysuria, frequency, hematuria, flank pain, vaginal bleeding and vaginal discharge.  Musculoskeletal: Negative for back pain.  Skin: Negative for rash.    Allergies  Review of patient's allergies indicates no known allergies.  Home Medications  No current outpatient prescriptions on file.  BP 137/98  Pulse 121  Temp 98.5 F (36.9 C) (Oral)  Resp 18  SpO2 100%  LMP 12/17/2011  Physical Exam  Nursing note and vitals reviewed. Constitutional: She is oriented to person, place, and time. She appears well-developed and well-nourished. No distress.  HENT:  Head: Normocephalic.  Cardiovascular: Normal rate and regular rhythm.   No murmur heard. Pulmonary/Chest: Effort normal and breath sounds normal. No respiratory distress. She has no wheezes. She has no rales.  Abdominal: Soft. There is no hepatosplenomegaly. There is tenderness in the left lower quadrant. There is no rigidity, no rebound, no guarding, no CVA tenderness, no tenderness at McBurney's point and negative Murphy's sign.       Pain is very mild  Neurological: She is alert and oriented to person, place, and time.  Skin: Skin is warm and dry. No rash noted. No erythema.  Psychiatric: She has a normal mood and affect. Her behavior is normal.    ED Course  Procedures   Results for orders placed during the hospital encounter of 02/01/12  URINALYSIS, ROUTINE W REFLEX MICROSCOPIC      Component Value Range   Color, Urine YELLOW  YELLOW   APPearance CLOUDY (*) CLEAR   Specific  Gravity, Urine 1.027  1.005 - 1.030   pH 8.0  5.0 - 8.0   Glucose, UA NEGATIVE  NEGATIVE mg/dL   Hgb urine dipstick NEGATIVE  NEGATIVE   Bilirubin Urine NEGATIVE  NEGATIVE   Ketones, ur NEGATIVE  NEGATIVE mg/dL   Protein, ur NEGATIVE  NEGATIVE mg/dL   Urobilinogen, UA 4.0 (*) 0.0 - 1.0 mg/dL   Nitrite NEGATIVE  NEGATIVE   Leukocytes, UA NEGATIVE  NEGATIVE  POCT PREGNANCY, URINE      Component Value Range   Preg Test, Ur NEGATIVE   NEGATIVE  WET PREP, GENITAL      Component Value Range   Yeast Wet Prep HPF POC NONE SEEN  NONE SEEN   Trich, Wet Prep NONE SEEN  NONE SEEN   Clue Cells Wet Prep HPF POC FEW (*) NONE SEEN   WBC, Wet Prep HPF POC FEW (*) NONE SEEN  POCT I-STAT, CHEM 8      Component Value Range   Sodium 140  135 - 145 mEq/L   Potassium 4.2  3.5 - 5.1 mEq/L   Chloride 106  96 - 112 mEq/L   BUN 11  6 - 23 mg/dL   Creatinine, Ser 8.11  0.50 - 1.10 mg/dL   Glucose, Bld 97  70 - 99 mg/dL   Calcium, Ion 9.14 (*) 1.12 - 1.23 mmol/L   TCO2 22  0 - 100 mmol/L   Hemoglobin 13.3  12.0 - 15.0 g/dL   HCT 78.2  95.6 - 21.3 %       US Transvaginal Non-ob  02/01/2012  *RADIOLOGY REPORT*  Clinical Data: Left adnexal pain  TRANSABDOMINAL AND TRANSVAGINAL ULTRASOUND OF PELVIS Technique:  Both transabdominal and transvaginal ultrasound examinations of the pelvis were performed. Transabdominal technique was performed for global imaging of the pelvis including uterus, ovaries, adnexal regions, and pelvic cul-de-sac.  It was necessary to proceed with endovaginal exam following the transabdominal exam to visualize the endometrium and adnexa.  Comparison:  None  Findings:  Uterus: Normal in size and appearance, measuring 7.1 x 4.0 x 4.7 cm.  Endometrium: Normal in thickness and appearance, measuring 9 mm.  Right ovary:  Normal appearance/no adnexal mass, measuring 3.3 x 2.9 x 3.0 cm. There is a 2 cm physiologic cyst / follicle.  Left ovary: Normal appearance/no adnexal mass, measuring 3.6 x 3.1 x 2.2 cm.  Other findings: No free fluid.  Color Doppler flow with arterial and venous wave forms documented to both ovaries.  The  IMPRESSION: No sonographic abnormality.  Color Doppler flow with arterial and venous wave forms documented to both ovaries.   Original Report Authenticated By: Waneta Martins, M.D.    US Pelvis Complete  02/01/2012  *RADIOLOGY REPORT*  Clinical Data: Left adnexal pain  TRANSABDOMINAL AND TRANSVAGINAL  ULTRASOUND OF PELVIS Technique:  Both transabdominal and transvaginal ultrasound examinations of the pelvis were performed. Transabdominal technique was performed for global imaging of the pelvis including uterus, ovaries, adnexal regions, and pelvic cul-de-sac.  It was necessary to proceed with endovaginal exam following the transabdominal exam to visualize the endometrium and adnexa.  Comparison:  None  Findings:  Uterus: Normal in size and appearance, measuring 7.1 x 4.0 x 4.7 cm.  Endometrium: Normal in thickness and appearance, measuring 9 mm.  Right ovary:  Normal appearance/no adnexal mass, measuring 3.3 x 2.9 x 3.0 cm. There is a 2 cm physiologic cyst / follicle.  Left ovary: Normal appearance/no adnexal mass, measuring 3.6 x 3.1 x 2.2 cm.  Other findings: No free fluid.  Color Doppler flow with arterial and venous wave forms documented to both ovaries.  The  IMPRESSION: No sonographic abnormality.  Color Doppler flow with arterial and venous wave forms documented to both ovaries.   Original Report Authenticated By: Waneta Martins, M.D.    [place] Art/ven Flow Abd Pelv Doppler  02/01/2012  *RADIOLOGY REPORT*  Clinical Data: Left adnexal pain  TRANSABDOMINAL AND TRANSVAGINAL ULTRASOUND OF PELVIS Technique:  Both transabdominal and transvaginal ultrasound examinations of the pelvis were performed. Transabdominal technique was performed for global imaging of the pelvis including uterus, ovaries, adnexal regions, and pelvic cul-de-sac.  It was necessary to proceed with endovaginal exam following the transabdominal exam to visualize the endometrium and adnexa.  Comparison:  None  Findings:  Uterus: Normal in size and appearance, measuring 7.1 x 4.0 x 4.7 cm.  Endometrium: Normal in thickness and appearance, measuring 9 mm.  Right ovary:  Normal appearance/no adnexal mass, measuring 3.3 x 2.9 x 3.0 cm. There is a 2 cm physiologic cyst / follicle.  Left ovary: Normal appearance/no adnexal mass, measuring 3.6 x  3.1 x 2.2 cm.  Other findings: No free fluid.  Color Doppler flow with arterial and venous wave forms documented to both ovaries.  The  IMPRESSION: No sonographic abnormality.  Color Doppler flow with arterial and venous wave forms documented to both ovaries.   Original Report Authenticated By: Waneta Martins, M.D.      1. Ovarian cyst   2. Pelvic pain in female       MDM  12:45AM patient seen and evaluated. Patient lying comfortably in no apparent distress or discomfort. Patient has normal heart rate on auscultation and palpation of distal pulses.        Angus Seller, [place] 02/01/12 [phone]

## 2012-02-02 LAB — GC/CHLAMYDIA PROBE AMP, GENITAL: GC Probe Amp, Genital: NEGATIVE

## 2013-05-29 ENCOUNTER — Encounter (HOSPITAL_COMMUNITY): Payer: Self-pay | Admitting: *Deleted

## 2013-05-29 ENCOUNTER — Inpatient Hospital Stay (HOSPITAL_COMMUNITY): Payer: Self-pay

## 2013-05-29 ENCOUNTER — Inpatient Hospital Stay (HOSPITAL_COMMUNITY)
Admission: AD | Admit: 2013-05-29 | Discharge: 2013-05-29 | Disposition: A | Discharge status: Home / Self Care | Payer: Self-pay | Source: Ambulatory Visit | Attending: Obstetrics & Gynecology | Admitting: Obstetrics & Gynecology

## 2013-05-29 DIAGNOSIS — N949 Unspecified condition associated with female genital organs and menstrual cycle: Secondary | ICD-10-CM | POA: Insufficient documentation

## 2013-05-29 DIAGNOSIS — M549 Dorsalgia, unspecified: Secondary | ICD-10-CM | POA: Insufficient documentation

## 2013-05-29 DIAGNOSIS — R52 Pain, unspecified: Secondary | ICD-10-CM | POA: Insufficient documentation

## 2013-05-29 DIAGNOSIS — R102 Pelvic and perineal pain: Secondary | ICD-10-CM

## 2013-05-29 DIAGNOSIS — N730 Acute parametritis and pelvic cellulitis: Secondary | ICD-10-CM | POA: Insufficient documentation

## 2013-05-29 DIAGNOSIS — R109 Unspecified abdominal pain: Secondary | ICD-10-CM | POA: Insufficient documentation

## 2013-05-29 HISTORY — DX: Unspecified ovarian cyst, unspecified side: N83.209

## 2013-05-29 LAB — URINALYSIS, ROUTINE W REFLEX MICROSCOPIC
Bilirubin Urine: NEGATIVE
Glucose, UA: NEGATIVE mg/dL
Hgb urine dipstick: NEGATIVE
Specific Gravity, Urine: >1.03 — ABNORMAL HIGH (ref 1.005–1.030)
Urobilinogen, UA: 1 mg/dL (ref 0.0–1.0)

## 2013-05-29 LAB — COMPREHENSIVE METABOLIC PANEL
AST: 16 U/L (ref 0–37)
BUN: 6 mg/dL (ref 6–23)
CO2: 26 mEq/L (ref 19–32)
Calcium: 9.2 mg/dL (ref 8.4–10.5)
Chloride: 100 mEq/L (ref 96–112)
Creatinine, Ser: 0.6 mg/dL (ref 0.50–1.10)
GFR calc Af Amer: >90 mL/min (ref >90)
GFR calc non Af Amer: >90 mL/min (ref >90)
Glucose, Bld: 108 mg/dL — ABNORMAL HIGH (ref 70–99)
Total Bilirubin: 0.3 mg/dL (ref 0.3–1.2)

## 2013-05-29 LAB — CBC WITH DIFFERENTIAL/PLATELET
Eosinophils Relative: 0 % (ref 0–5)
HCT: 34.7 % — ABNORMAL LOW (ref 36.0–46.0)
Hemoglobin: 11.8 g/dL — ABNORMAL LOW (ref 12.0–15.0)
Lymphocytes Relative: 10 % — ABNORMAL LOW (ref 12–46)
Lymphs Abs: 1 10*3/uL (ref 0.7–4.0)
MCV: 85.3 fL (ref 78.0–100.0)
Monocytes Absolute: 0.6 10*3/uL (ref 0.1–1.0)
Monocytes Relative: 7 % (ref 3–12)
Neutro Abs: 7.8 10*3/uL — ABNORMAL HIGH (ref 1.7–7.7)
WBC: 9.5 10*3/uL (ref 4.0–10.5)

## 2013-05-29 LAB — WET PREP, GENITAL

## 2013-05-29 LAB — POCT PREGNANCY, URINE: Preg Test, Ur: NEGATIVE

## 2013-05-29 MED ORDER — TRAMADOL HCL 50 MG PO TABS: 50.0000 mg | ORAL_TABLET | Freq: Four times a day (QID) | ORAL | Status: DC | PRN

## 2013-05-29 MED ORDER — AZITHROMYCIN 250 MG PO TABS
1000.0000 mg | ORAL_TABLET | Freq: Once | ORAL | Status: AC
  Administered 2013-05-29: 1000 mg via ORAL
  Filled 2013-05-29: qty 4

## 2013-05-29 MED ORDER — AZITHROMYCIN 250 MG PO TABS: 1000.0000 mg | ORAL_TABLET | Freq: Once | ORAL | Status: DC

## 2013-05-29 MED ORDER — TRAMADOL HCL 50 MG PO TABS
100.0000 mg | ORAL_TABLET | Freq: Once | ORAL | Status: AC
  Administered 2013-05-29: 100 mg via ORAL
  Filled 2013-05-29: qty 2

## 2013-05-29 MED ORDER — CEFTRIAXONE SODIUM 250 MG IJ SOLR
250.0000 mg | Freq: Once | INTRAMUSCULAR | Status: AC
  Administered 2013-05-29: 250 mg via INTRAMUSCULAR
  Filled 2013-05-29: qty 250

## 2013-05-29 MED ORDER — IBUPROFEN 200 MG PO TABS: 800.0000 mg | ORAL_TABLET | Freq: Three times a day (TID) | ORAL | Status: DC | PRN

## 2013-05-29 MED ORDER — ONDANSETRON HCL 4 MG PO TABS
4.0000 mg | ORAL_TABLET | Freq: Once | ORAL | Status: AC
  Administered 2013-05-29: 4 mg via ORAL
  Filled 2013-05-29: qty 1

## 2013-05-29 NOTE — MAU Note (Signed)
Feels like my back and stomach are gonna fall out. Had ovarian cyst 2 yrs ago but never followed up. Pain started last night and took 2 Advils and didn't help.

## 2013-05-29 NOTE — MAU Provider Note (Signed)
History     CSN: [id number]  Arrival date and time: 05/29/13 1901   First Provider Initiated Contact with Patient 05/29/13 2004      Chief Complaint  Patient presents with  . Back Pain  . Abdominal Pain   HPI This is a 22 y.o. female who presents with c/o severe abdominal pelvic pain,.  States has had this pain off and on for 2 years. Has only ever been to ED for evaluation. Was told two years ago she had an ovarian cyst  "but I was too hard-headed to go to the doctor".  No abnormal bleeding.  Has vaginal discharge. States has not had sex in 2 years. Never had STD testing after last intercourse. No fever or other symptoms. Started period Monday and ended today (normal cycle, has had no menstrual irregularities.   Texting and resting prior to interview. Does not appear to be in pain.   RN Note: Feels like my back and stomach are gonna fall out. Had ovarian cyst 2 yrs ago but never followed up. Pain started last night and took 2 Advils and didn't help.        OB History   Grav Para Term Preterm Abortions TAB SAB Ect Mult Living                  Past Medical History  Diagnosis Date  . GERD (gastroesophageal reflux disease)   . Anxiety   . Ovarian cyst     Past Surgical History  Procedure Laterality Date  . No past surgeries      No family history on file.  History  Substance Use Topics  . Smoking status: Never Smoker   . Smokeless tobacco: Not on file  . Alcohol Use: Yes    Allergies: No Known Allergies  No prescriptions prior to admission    Review of Systems  Constitutional: Negative for fever, chills and malaise/fatigue.  Gastrointestinal: Positive for abdominal pain (pelvis). Negative for nausea, vomiting, diarrhea and constipation.  Genitourinary: Negative for dysuria.  Musculoskeletal: Negative for myalgias.  Neurological: Negative for dizziness.   Physical Exam   Temperature 98.7 F (37.1 C), resp. rate 20, height 5\' 2"  (1.575 m), weight 51.891  kg (114 lb 6.4 oz), last menstrual period 05/25/2013, SpO2 100.00%.  Physical Exam  Constitutional: She is oriented to person, place, and time. She appears well-developed and well-nourished. No distress (texting).  Cardiovascular: Normal rate.   Respiratory: Effort normal.  GI: Soft.  Genitourinary: Vaginal discharge (thin white) found.  Cervix closed Uterus tender Adnexae nontender bilaterally + cervical motion tenderness   Musculoskeletal: Normal range of motion.  Neurological: She is alert and oriented to person, place, and time.  Skin: Skin is warm and dry.  Psychiatric: She has a normal mood and affect.    MAU Course  Procedures  MDM Results for orders placed during the hospital encounter of 05/29/13 (from the past 24 hour(s))  URINALYSIS, ROUTINE W REFLEX MICROSCOPIC     Status: Abnormal   Collection Time    05/29/13  7:45 PM      Result Value Range   Color, Urine YELLOW  YELLOW   APPearance CLEAR  CLEAR   Specific Gravity, Urine >1.030 (*) 1.005 - 1.030   pH 5.5  5.0 - 8.0   Glucose, UA NEGATIVE  NEGATIVE mg/dL   Hgb urine dipstick NEGATIVE  NEGATIVE   Bilirubin Urine NEGATIVE  NEGATIVE   Ketones, ur NEGATIVE  NEGATIVE mg/dL   Protein, ur NEGATIVE  NEGATIVE mg/dL   Urobilinogen, UA 1.0  0.0 - 1.0 mg/dL   Nitrite NEGATIVE  NEGATIVE   Leukocytes, UA NEGATIVE  NEGATIVE  POCT PREGNANCY, URINE     Status: None   Collection Time    05/29/13  7:55 PM      Result Value Range   Preg Test, Ur NEGATIVE  NEGATIVE  WET PREP, GENITAL     Status: Abnormal   Collection Time    05/29/13  8:14 PM      Result Value Range   Yeast Wet Prep HPF POC NONE SEEN  NONE SEEN   Trich, Wet Prep NONE SEEN  NONE SEEN   Clue Cells Wet Prep HPF POC FEW (*) NONE SEEN   WBC, Wet Prep HPF POC MODERATE (*) NONE SEEN  CBC WITH DIFFERENTIAL     Status: Abnormal   Collection Time    05/29/13  8:35 PM      Result Value Range   WBC 9.5  4.0 - 10.5 K/uL   RBC 4.07  3.87 - 5.11 MIL/uL    Hemoglobin 11.8 (*) 12.0 - 15.0 g/dL   HCT 47.8 (*) 29.5 - 62.1 %   MCV 85.3  78.0 - 100.0 fL   MCH 29.0  26.0 - 34.0 pg   MCHC 34.0  30.0 - 36.0 g/dL   RDW 30.8  65.7 - 84.6 %   Platelets 238  150 - 400 K/uL   Neutrophils Relative % 83 (*) 43 - 77 %   Neutro Abs 7.8 (*) 1.7 - 7.7 K/uL   Lymphocytes Relative 10 (*) 12 - 46 %   Lymphs Abs 1.0  0.7 - 4.0 K/uL   Monocytes Relative 7  3 - 12 %   Monocytes Absolute 0.6  0.1 - 1.0 K/uL   Eosinophils Relative 0  0 - 5 %   Eosinophils Absolute 0.0  0.0 - 0.7 K/uL   Basophils Relative 0  0 - 1 %   Basophils Absolute 0.0  0.0 - 0.1 K/uL  COMPREHENSIVE METABOLIC PANEL     Status: Abnormal   Collection Time    05/29/13  8:35 PM      Result Value Range   Sodium 136  135 - 145 mEq/L   Potassium 3.4 (*) 3.5 - 5.1 mEq/L   Chloride 100  96 - 112 mEq/L   CO2 26  19 - 32 mEq/L   Glucose, Bld 108 (*) 70 - 99 mg/dL   BUN 6  6 - 23 mg/dL   Creatinine, Ser 9.62  0.50 - 1.10 mg/dL   Calcium 9.2  8.4 - 95.2 mg/dL   Total Protein 7.7  6.0 - 8.3 g/dL   Albumin 3.9  3.5 - 5.2 g/dL   AST 16  0 - 37 U/L   ALT 6  0 - 35 U/L   Alkaline Phosphatase 43  39 - 117 U/L   Total Bilirubin 0.3  0.3 - 1.2 mg/dL   GFR calc non Af Amer >90  >90 mL/min   GFR calc Af Amer >90  >90 mL/min   Ultrasound ordered  Assessment and Plan  Report to Ivonne Andrew CNM  [facility] 05/29/2013, 8:03 PM   Dorathy Kinsman, CNM assumed care of patient at 2100. Patient in ultrasound.  US Pelvis Complete  05/29/2013   CLINICAL DATA:  Pelvic pain  EXAM: TRANSABDOMINAL AND TRANSVAGINAL ULTRASOUND OF PELVIS  TECHNIQUE: Both transabdominal and transvaginal ultrasound examinations of the pelvis were performed. Transabdominal technique was performed  for global imaging of the pelvis including uterus, ovaries, adnexal regions, and pelvic cul-de-sac. It was necessary to proceed with endovaginal exam following the transabdominal exam to visualize the uterus and endometrium.  COMPARISON:  None   FINDINGS: Uterus  Measurements: 7.1 x 3.4 x 4.4 cm. No fibroids or other mass visualized.  Endometrium  Thickness: 3 mm.  No focal abnormality visualized.  Right ovary  Measurements: 4.5 x 2.7 x 2.7 cm. Normal appearance/no adnexal mass.  Left ovary  Measurements: 3.6 x 2.6 x 2.9 cm. Normal appearance/no adnexal mass.  Other findings  No free fluid.  IMPRESSION: 1. No acute findings.  Normal exam.   Electronically Signed   By: Signa Kell M.D.   On: 05/29/2013 21:33   MAU course: Reviewed normal ultrasound results, blood work and urinalysis. Unsure of exact etiology of pain, but have concern for mild PID do to significant cervical motion tenderness. We'll treat for PID. Requesting pain medication. Rocephin, and azithromycin and tramadol ordered.  ASSESSMENT: 1. PID (acute pelvic inflammatory disease)   2. Unspecified symptom associated with female genital organs   3. Acute pelvic pain, female     PLAN: Discharge in stable condition. No sexual intercourse until pain resolved. Follow-up Information   Follow up with [facility]. (As needed if symptoms worsen)    Specialty:  Obstetrics and Gynecology   Contact information:   [address] [place] [address] [phone]      Follow up with [facility] [place] [facility]ERN[facility]. (As needed in the emergencies)    Contact information:   [address] [id number] [place] [address] [phone]        Medication List         azithromycin 250 MG tablet  Commonly known as:  ZITHROMAX  Take 4 tablets (1,000 mg total) by mouth once.  Start taking on:  06/05/2013     ibuprofen 200 MG tablet  Commonly known as:  ADVIL,MOTRIN  Take 4 tablets (800 mg total) by mouth every 8 (eight) hours as needed. Take every 8 hours on schedule for 48 hours.     traMADol 50 MG tablet  Commonly known as:  ULTRAM  Take 1-2 tablets (50-100 mg total) by mouth every 6 (six) hours as needed for moderate  pain or severe pain. For breakthrough pain if Ibuprofen is not reliving your pain.        [place], CNM 05/29/2013 10:04 PM

## 2013-05-30 NOTE — MAU Provider Note (Signed)
Attestation of Attending Supervision of Advanced Practitioner (CNM/NP): Evaluation and management procedures were performed by the Advanced Practitioner under my supervision and collaboration.  I have reviewed the Advanced Practitioner's note and chart, and I agree with the management and plan.  HARRAWAY-SMITH, Cyan [place] 1:31 AM

## 2013-05-31 LAB — GC/CHLAMYDIA PROBE AMP
CT Probe RNA: NEGATIVE
GC Probe RNA: NEGATIVE

## 2013-06-04 HISTORY — PX: DILATION AND CURETTAGE OF UTERUS: SHX78

## 2013-08-06 ENCOUNTER — Emergency Department (HOSPITAL_COMMUNITY): Payer: No Typology Code available for payment source

## 2013-08-06 ENCOUNTER — Emergency Department (HOSPITAL_COMMUNITY)
Admission: EM | Admit: 2013-08-06 | Discharge: 2013-08-06 | Disposition: A | Discharge status: Home / Self Care | Payer: No Typology Code available for payment source | Attending: Emergency Medicine | Admitting: Emergency Medicine

## 2013-08-06 ENCOUNTER — Encounter (HOSPITAL_COMMUNITY): Payer: Self-pay | Admitting: Emergency Medicine

## 2013-08-06 DIAGNOSIS — F411 Generalized anxiety disorder: Secondary | ICD-10-CM | POA: Insufficient documentation

## 2013-08-06 DIAGNOSIS — Y9389 Activity, other specified: Secondary | ICD-10-CM | POA: Insufficient documentation

## 2013-08-06 DIAGNOSIS — S161XXA Strain of muscle, fascia and tendon at neck level, initial encounter: Secondary | ICD-10-CM

## 2013-08-06 DIAGNOSIS — Y92410 Unspecified street and highway as the place of occurrence of the external cause: Secondary | ICD-10-CM | POA: Insufficient documentation

## 2013-08-06 DIAGNOSIS — S139XXA Sprain of joints and ligaments of unspecified parts of neck, initial encounter: Secondary | ICD-10-CM | POA: Insufficient documentation

## 2013-08-06 DIAGNOSIS — Z8719 Personal history of other diseases of the digestive system: Secondary | ICD-10-CM | POA: Insufficient documentation

## 2013-08-06 DIAGNOSIS — Z8742 Personal history of other diseases of the female genital tract: Secondary | ICD-10-CM | POA: Insufficient documentation

## 2013-08-06 MED ORDER — IBUPROFEN 200 MG PO TABS
600.0000 mg | ORAL_TABLET | Freq: Once | ORAL | Status: AC
  Administered 2013-08-06: 600 mg via ORAL
  Filled 2013-08-06: qty 3

## 2013-08-06 NOTE — Progress Notes (Signed)
[facility] provided pt with a list of primary care resources to help patient establish primary care.  °
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
## 2013-09-15 ENCOUNTER — Inpatient Hospital Stay (HOSPITAL_COMMUNITY)
Admission: AD | Admit: 2013-09-15 | Discharge: 2013-09-15 | Disposition: A | Discharge status: Home / Self Care | Payer: No Typology Code available for payment source | Source: Ambulatory Visit | Attending: Obstetrics & Gynecology | Admitting: Obstetrics & Gynecology

## 2013-09-15 ENCOUNTER — Encounter (HOSPITAL_COMMUNITY): Payer: Self-pay | Admitting: *Deleted

## 2013-09-15 DIAGNOSIS — R109 Unspecified abdominal pain: Secondary | ICD-10-CM | POA: Insufficient documentation

## 2013-09-15 DIAGNOSIS — B3731 Acute candidiasis of vulva and vagina: Secondary | ICD-10-CM

## 2013-09-15 DIAGNOSIS — B373 Candidiasis of vulva and vagina: Secondary | ICD-10-CM | POA: Insufficient documentation

## 2013-09-15 DIAGNOSIS — K219 Gastro-esophageal reflux disease without esophagitis: Secondary | ICD-10-CM | POA: Insufficient documentation

## 2013-09-15 DIAGNOSIS — R110 Nausea: Secondary | ICD-10-CM | POA: Insufficient documentation

## 2013-09-15 DIAGNOSIS — F411 Generalized anxiety disorder: Secondary | ICD-10-CM | POA: Insufficient documentation

## 2013-09-15 HISTORY — DX: Chlamydial infection, unspecified: A74.9

## 2013-09-15 HISTORY — DX: Unspecified abnormal cytological findings in specimens from vagina: R87.629

## 2013-09-15 LAB — URINALYSIS, ROUTINE W REFLEX MICROSCOPIC
Bilirubin Urine: NEGATIVE
Glucose, UA: NEGATIVE mg/dL
KETONES UR: 15 mg/dL — AB
Nitrite: NEGATIVE
PROTEIN: NEGATIVE mg/dL
Specific Gravity, Urine: >1.03 — ABNORMAL HIGH (ref 1.005–1.030)
UROBILINOGEN UA: 2 mg/dL — AB (ref 0.0–1.0)
pH: 6 (ref 5.0–8.0)

## 2013-09-15 LAB — WET PREP, GENITAL
CLUE CELLS WET PREP: NONE SEEN
TRICH WET PREP: NONE SEEN

## 2013-09-15 LAB — URINE MICROSCOPIC-ADD ON

## 2013-09-15 LAB — POCT PREGNANCY, URINE: PREG TEST UR: NEGATIVE

## 2013-09-15 MED ORDER — FLUCONAZOLE 150 MG PO TABS: 150.0000 mg | ORAL_TABLET | Freq: Once | ORAL | Status: DC

## 2013-09-15 MED ORDER — FLUCONAZOLE 150 MG PO TABS
150.0000 mg | ORAL_TABLET | Freq: Once | ORAL | Status: AC
  Administered 2013-09-15: 150 mg via ORAL
  Filled 2013-09-15: qty 1

## 2013-09-15 MED ORDER — NYSTATIN-TRIAMCINOLONE 100000-0.1 UNIT/GM-% EX CREA
TOPICAL_CREAM | Freq: Once | CUTANEOUS | Status: AC
Start: 2013-09-15 — End: 2013-09-15
  Administered 2013-09-15: 15:00:00 via TOPICAL
  Filled 2013-09-15: qty 15

## 2013-09-15 NOTE — MAU Provider Note (Signed)
Attestation of Attending Supervision of Advanced Practitioner (PA/CNM/NP): Evaluation and management procedures were performed by the Advanced Practitioner under my supervision and collaboration.  I have reviewed the Advanced Practitioner's note and chart, and I agree with the management and plan.  Tanya S Pratt, MD [facility] Attending 09/15/2013 3:21 PM   

## 2013-09-15 NOTE — Discharge Instructions (Signed)
Candidal Vulvovaginitis  Candidal vulvovaginitis is an infection of the vagina and vulva. The vulva is the skin around the opening of the vagina. This may cause itching and discomfort in and around the vagina.   HOME CARE  · Only take medicine as told by your doctor.  · Do not have sex (intercourse) until the infection is healed or as told by your doctor.  · Practice safe sex.  · Tell your sex partner about your infection.  · Do not douche or use tampons.  · Wear cotton underwear. Do not wear tight pants or panty hose.  · Eat yogurt. This may help treat and prevent yeast infections.  GET HELP RIGHT AWAY IF:   · You have a fever.  · Your problems get worse during treatment or do not get better in 3 days.  · You have discomfort, irritation, or itching in your vagina or vulva area.  · You have pain after sex.  · You start to get belly (abdominal) pain.  MAKE SURE YOU:  · Understand these instructions.  · Will watch your condition.  · Will get help right away if you are not doing well or get worse.  Document Released: 08/17/2008 Document Revised: 08/13/2011 Document Reviewed: 08/17/2008  Exit[employer]® Patient Information ©2014 [employer].

## 2013-09-15 NOTE — MAU Note (Signed)
Patient states she has had a white vaginal discharge with itching for about one week. Has a "cut" in the vaginal area for about one week. Has been having mid to upper abdominal pain for about one week. Denies nausea or vomiting.

## 2013-09-15 NOTE — MAU Provider Note (Signed)
History     CSN: [id number]  Arrival date and time: 09/15/13 1215   First Provider Initiated Contact with Patient 09/15/13 1330      Chief Complaint  Patient presents with  . Vaginal Discharge  . Abdominal Pain  . Possible Pregnancy   HPI  Pt is not pregnant and presents with vaginal itching and thick white vaginal discharge. Pt has noticed a "cut" on the outside of her vaginal that has been hurting. Pt states she has this one time before but it went away. Pt also has lower abd pain and also upper abd pain with hx Of reflux.  Pt is nauseated with decreased appetite.  Pt has not had bowel movement in 2 days.    Past Medical History  Diagnosis Date  . GERD (gastroesophageal reflux disease)   . Anxiety   . Ovarian cyst   . Vaginal Pap smear, abnormal   . Chlamydia     Past Surgical History  Procedure Laterality Date  . No past surgeries      No family history on file.  History  Substance Use Topics  . Smoking status: Never Smoker   . Smokeless tobacco: Not on file  . Alcohol Use: Yes     Comment: occas.    Allergies: No Known Allergies  Prescriptions prior to admission  Medication Sig Dispense Refill  . acetaminophen (TYLENOL) 325 MG tablet Take 650 mg by mouth every 6 (six) hours as needed for moderate pain or headache.        Review of Systems  Constitutional: Negative for fever and chills.  Gastrointestinal: Positive for nausea, abdominal pain and constipation. Negative for vomiting.   Physical Exam   Blood pressure 115/76, pulse 93, resp. rate 16, height 5\' 2"  (1.575 m), weight 52.073 kg (114 lb 12.8 oz), last menstrual period 08/19/2013, SpO2 100.00%.  Physical Exam  Nursing note and vitals reviewed. Constitutional: She is oriented to person, place, and time. She appears well-developed and well-nourished. No distress.  HENT:  Head: Normocephalic.  Eyes: Pupils are equal, round, and reactive to light.  Neck: Normal range of motion. Neck supple.   Cardiovascular: Normal rate.   Respiratory: Effort normal.  GI: Soft. She exhibits no distension. There is no tenderness. There is no rebound and no guarding.  Genitourinary:  Small slightly ulcerated lesion  At top of right labia minora, tender to touch- HSV culture sent: very small reddened lesion? At top of clitoral hood- mildly tender; vaginal mucosa extremely reddened with mod- large amount of clumpy white discharge in vault; cervic NT ;uterus NSSC NT; adnexa without palpable enlargement or tenderness  Musculoskeletal: Normal range of motion.  Neurological: She is alert and oriented to person, place, and time.  Skin: Skin is warm and dry.  Psychiatric: She has a normal mood and affect.    MAU Course  Procedures Results for orders placed during the hospital encounter of 09/15/13 (from the past 24 hour(s))  URINALYSIS, ROUTINE W REFLEX MICROSCOPIC     Status: Abnormal   Collection Time    09/15/13 12:50 PM      Result Value Ref Range   Color, Urine YELLOW  YELLOW   APPearance HAZY (*) CLEAR   Specific Gravity, Urine >1.030 (*) 1.005 - 1.030   pH 6.0  5.0 - 8.0   Glucose, UA NEGATIVE  NEGATIVE mg/dL   Hgb urine dipstick TRACE (*) NEGATIVE   Bilirubin Urine NEGATIVE  NEGATIVE   Ketones, ur 15 (*) NEGATIVE mg/dL  Protein, ur NEGATIVE  NEGATIVE mg/dL   Urobilinogen, UA 2.0 (*) 0.0 - 1.0 mg/dL   Nitrite NEGATIVE  NEGATIVE   Leukocytes, UA LARGE (*) NEGATIVE  URINE MICROSCOPIC-ADD ON     Status: Abnormal   Collection Time    09/15/13 12:50 PM      Result Value Ref Range   Squamous Epithelial / LPF MANY (*) RARE   WBC, UA 21-50  <3 WBC/hpf   RBC / HPF 7-10  <3 RBC/hpf   Bacteria, UA MANY (*) RARE   Urine-Other MANY YEAST    POCT PREGNANCY, URINE     Status: None   Collection Time    09/15/13 12:57 PM      Result Value Ref Range   Preg Test, Ur NEGATIVE  NEGATIVE  WET PREP, GENITAL     Status: Abnormal   Collection Time    09/15/13  1:56 PM      Result Value Ref Range    Yeast Wet Prep HPF POC MANY (*) NONE SEEN   Trich, Wet Prep NONE SEEN  NONE SEEN   Clue Cells Wet Prep HPF POC NONE SEEN  NONE SEEN   WBC, Wet Prep HPF POC MANY (*) NONE SEEN  one dose of Diflucan 150mg  given in MAU And nystatin/triamcinolone cream given GC/chlamydia and HSV culture pending   Assessment and Plan  Yeast vaginitis- Diflucan and mycolog cream  Jean RosenthalSusan P Lennon Richins 09/15/2013, 1:31 PM

## 2013-09-16 LAB — URINE CULTURE
COLONY COUNT: NO GROWTH
CULTURE: NO GROWTH

## 2013-09-16 LAB — GC/CHLAMYDIA PROBE AMP
CT PROBE, AMP APTIMA: NEGATIVE
GC PROBE AMP APTIMA: NEGATIVE

## 2013-09-17 LAB — HERPES SIMPLEX VIRUS CULTURE
Culture: DETECTED
Special Requests: NORMAL

## 2014-02-21 IMAGING — CR DG CHEST 2V
2 series · 2 of 2 positions shown · non-contrast
Comparison: 05/03/2010

CLINICAL DATA: Shortness of breath.

CHEST - 2 VIEW

[w chest pa]
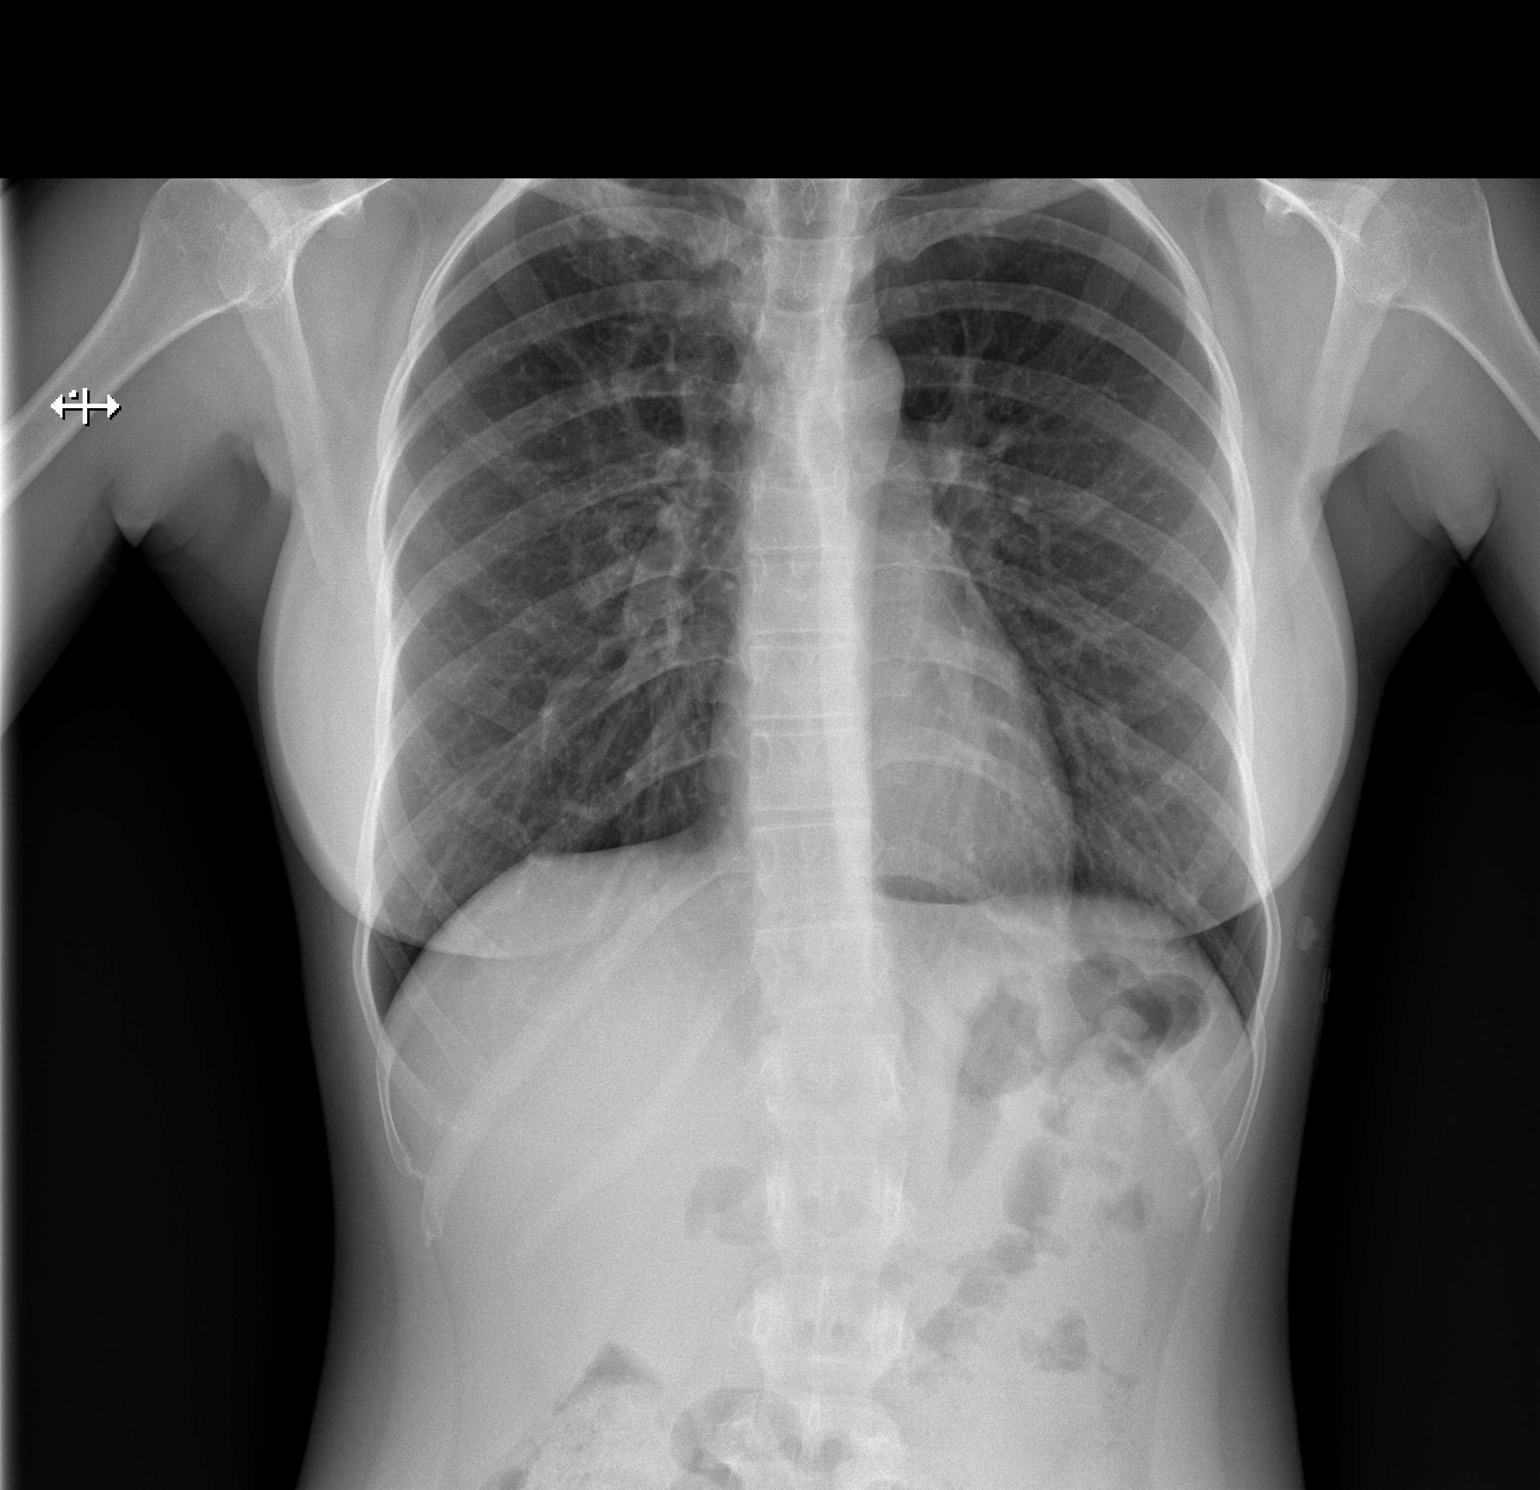

[w chest lat]
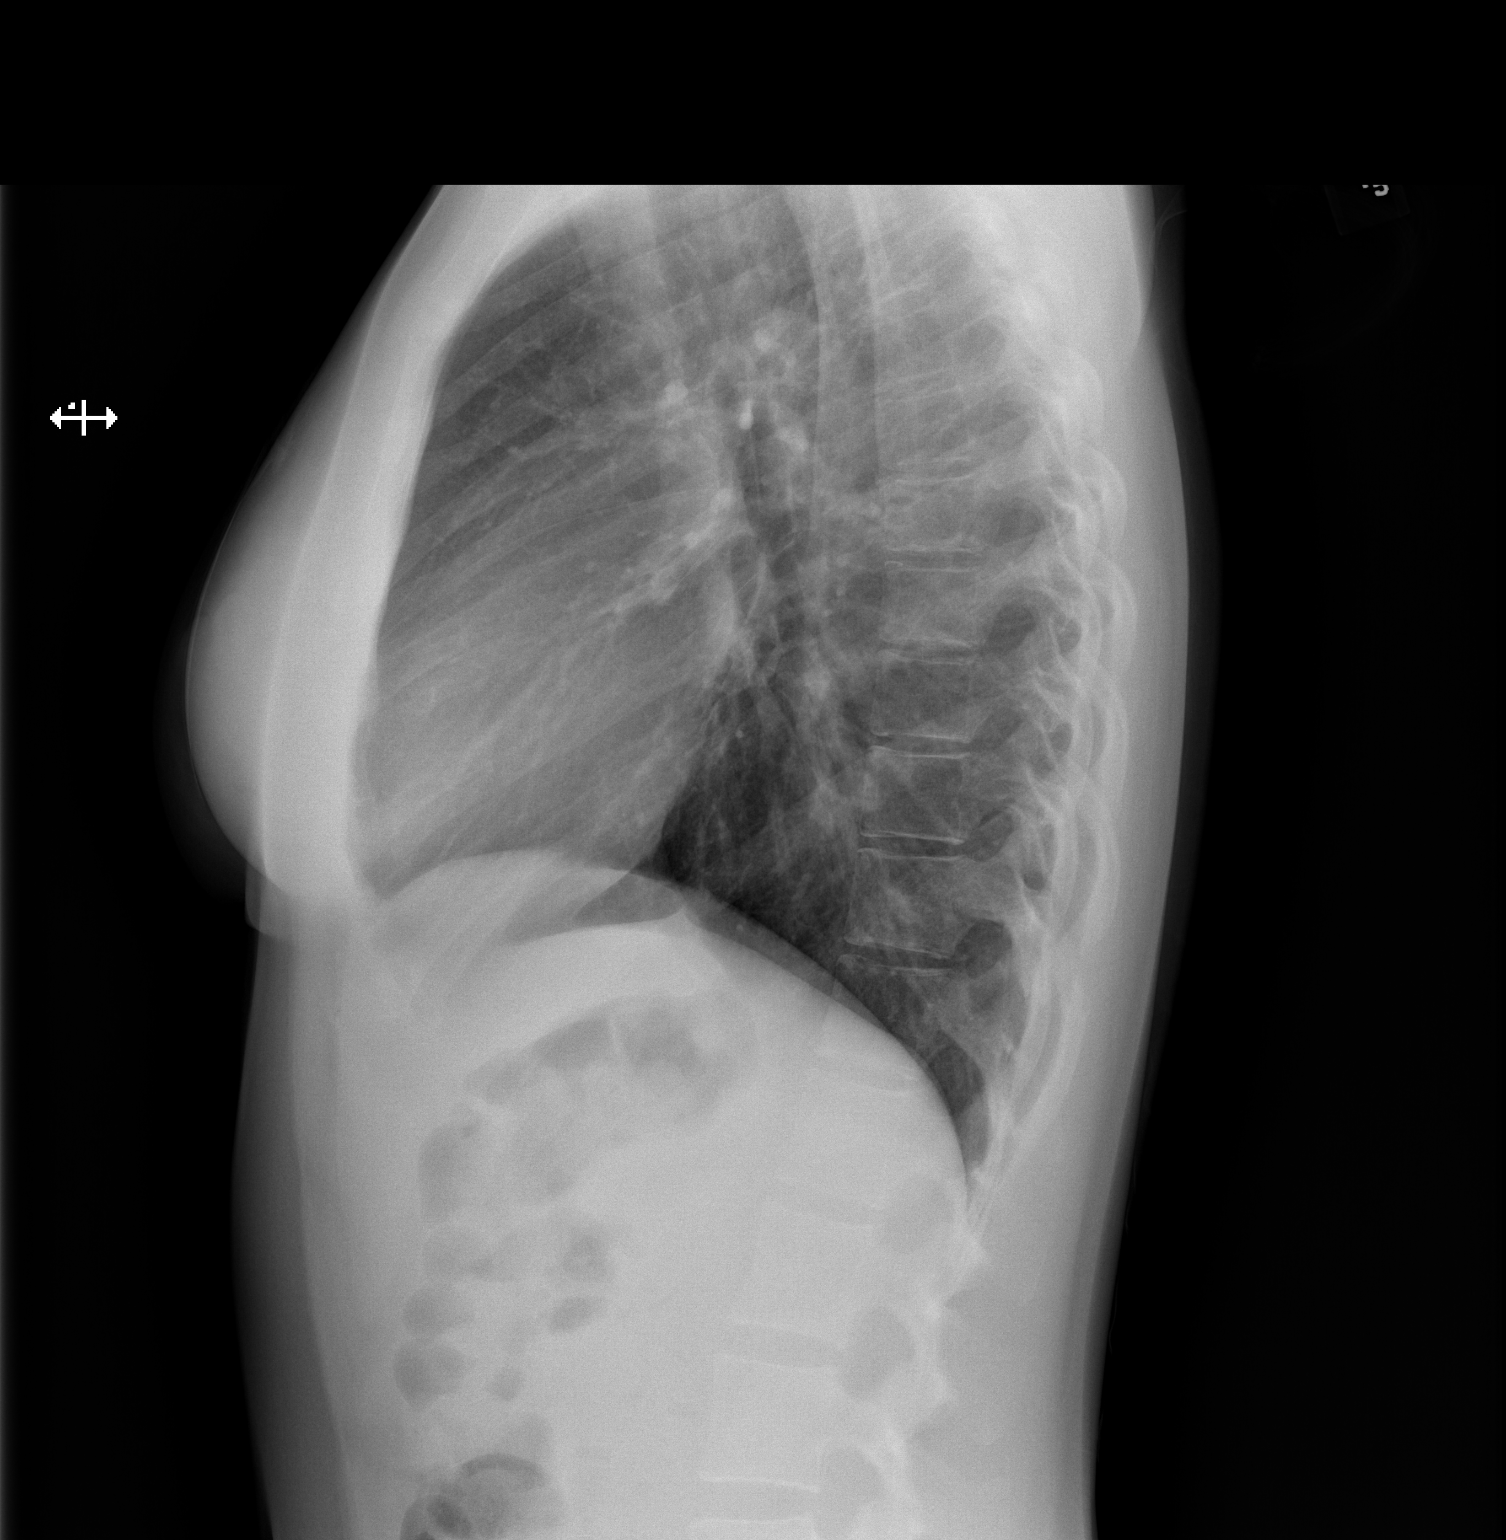

[2 of 2 positions shown; findings below may reference images not displayed]

FINDINGS: Two-view exam of the chest shows no dense airspace
consolidation, pulmonary edema, pleural effusion.  There is some
patchy ill-defined density over the right apex which is probably
external to the patient. The cardiopericardial silhouette is within
normal limits for size. Imaged bony structures of the thorax are
intact.
IMPRESSION: Ill-defined density over the right apex is probably external to the
patient.  A repeat frontal radiograph is recommended to confirm.

## 2014-03-17 ENCOUNTER — Encounter (HOSPITAL_COMMUNITY): Payer: Self-pay | Admitting: *Deleted

## 2014-03-17 ENCOUNTER — Inpatient Hospital Stay (HOSPITAL_COMMUNITY)
Admission: AD | Admit: 2014-03-17 | Discharge: 2014-03-17 | Disposition: A | Discharge status: Home / Self Care | Payer: Medicaid Other | Source: Ambulatory Visit | Attending: Obstetrics & Gynecology | Admitting: Obstetrics & Gynecology

## 2014-03-17 DIAGNOSIS — Z3A01 Less than 8 weeks gestation of pregnancy: Secondary | ICD-10-CM | POA: Insufficient documentation

## 2014-03-17 DIAGNOSIS — O210 Mild hyperemesis gravidarum: Secondary | ICD-10-CM | POA: Diagnosis not present

## 2014-03-17 DIAGNOSIS — Z3201 Encounter for pregnancy test, result positive: Secondary | ICD-10-CM | POA: Insufficient documentation

## 2014-03-17 DIAGNOSIS — O219 Vomiting of pregnancy, unspecified: Secondary | ICD-10-CM

## 2014-03-17 DIAGNOSIS — Z3200 Encounter for pregnancy test, result unknown: Secondary | ICD-10-CM | POA: Diagnosis present

## 2014-03-17 LAB — URINALYSIS, ROUTINE W REFLEX MICROSCOPIC
BILIRUBIN URINE: NEGATIVE
Glucose, UA: NEGATIVE mg/dL
Hgb urine dipstick: NEGATIVE
KETONES UR: NEGATIVE mg/dL
NITRITE: NEGATIVE
PROTEIN: NEGATIVE mg/dL
Specific Gravity, Urine: 1.01 (ref 1.005–1.030)
UROBILINOGEN UA: 0.2 mg/dL (ref 0.0–1.0)
pH: 6 (ref 5.0–8.0)

## 2014-03-17 LAB — POCT PREGNANCY, URINE: Preg Test, Ur: POSITIVE — AB

## 2014-03-17 LAB — URINE MICROSCOPIC-ADD ON

## 2014-03-17 MED ORDER — METOCLOPRAMIDE HCL 10 MG PO TABS: 10.0000 mg | ORAL_TABLET | Freq: Three times a day (TID) | ORAL | Status: DC | PRN

## 2014-03-17 NOTE — MAU Provider Note (Signed)
History     CSN: [id number]  Arrival date and time: 03/17/14 [id number]   First Provider Initiated Contact with Patient 03/17/14 0800      Chief Complaint  Patient presents with  . Possible Pregnancy  . Emesis   HPI Robin Lloyd 23 y.o. G1P0 @[redacted]w[redacted]d  presents to MAU for pregnancy test.  She has been having nausea but this is not bothersome to her.  No vomiting.  She noticed a decrease in appetite.  She took a home pregnancy test this morning and it was positive.  She denies abdominal pain or vaginal bleeding.  Also denies dysuria or vaginal discharge. OB History   Grav Para Term Preterm Abortions TAB SAB Ect Mult Living   1               Past Medical History  Diagnosis Date  . GERD (gastroesophageal reflux disease)   . Anxiety   . Ovarian cyst   . Vaginal Pap smear, abnormal   . Chlamydia     Past Surgical History  Procedure Laterality Date  . No past surgeries      History reviewed. No pertinent family history.  History  Substance Use Topics  . Smoking status: Never Smoker   . Smokeless tobacco: Not on file  . Alcohol Use: Yes     Comment: occas.    Allergies: No Known Allergies  Prescriptions prior to admission  Medication Sig Dispense Refill  . acetaminophen (TYLENOL) 325 MG tablet Take 650 mg by mouth every 6 (six) hours as needed for moderate pain or headache.      . fluconazole (DIFLUCAN) 150 MG tablet Take 1 tablet (150 mg total) by mouth once. May repeat in 3 days if still having symptoms  2 tablet  0    Review of Systems  Constitutional: Negative for fever, chills and diaphoresis.  HENT: Negative for congestion and sore throat.   Eyes: Negative for blurred vision and double vision.  Respiratory: Positive for shortness of breath. Negative for cough and wheezing.   Cardiovascular: Negative for chest pain and palpitations.  Gastrointestinal: Negative for nausea, vomiting and abdominal pain.  Genitourinary: Negative for dysuria.  Musculoskeletal:  Negative for back pain.  Skin: Negative for itching and rash.  Neurological: Positive for weakness. Negative for dizziness and headaches.  Psychiatric/Behavioral: Negative for depression, suicidal ideas and substance abuse. The patient is nervous/anxious.    Physical Exam   Blood pressure 116/76, pulse 92, temperature 98.8 F (37.1 C), temperature source Oral, resp. rate 18, height 5' 2.5" (1.588 m), weight 118 lb 6.4 oz (53.706 kg), last menstrual period 02/07/2014.  Physical Exam  Constitutional: She is oriented to person, place, and time. She appears well-developed and well-nourished. No distress.  HENT:  Head: Normocephalic and atraumatic.  Eyes: EOM are normal.  Neck: Normal range of motion.  Cardiovascular: Normal rate and regular rhythm.   Respiratory: Effort normal and breath sounds normal. No respiratory distress.  GI: Soft. Bowel sounds are normal. She exhibits no distension. There is no tenderness.  Musculoskeletal: Normal range of motion.  Neurological: She is alert and oriented to person, place, and time.  Skin: Skin is warm and dry.  Psychiatric: She has a normal mood and affect.   Pt declines pelvic exam.   MAU Course  Procedures none MDM Positive pregnancy test   Assessment and Plan  A: nausea in early pregnancy.  No abdominal pain or bleeding.  P: Discharge to home PNV qd Reglan for nausea prn [facility]  List of providers given. Ectopic precautions given.    Bertram Denvereague Clark, Karen E 03/17/2014, 8:06 AM

## 2014-03-17 NOTE — Progress Notes (Addendum)
Patient states the N/V is not that bad. She just wanted a pregnancy test. Patient given pregnancy verification letter with instructions for filing for medicaid, list of providers and work note.

## 2014-03-17 NOTE — MAU Provider Note (Signed)
Attestation of Attending Supervision of Advanced Practitioner (PA/CNM/NP): Evaluation and management procedures were performed by the Advanced Practitioner under my supervision and collaboration.  I have reviewed the Advanced Practitioner's note and chart, and I agree with the management and plan.  Kyan Giannone, MD, FACOG Attending Obstetrician & Gynecologist Faculty Practice, [facility]   

## 2014-03-17 NOTE — MAU Note (Signed)
Patient states she has had a positive home pregnancy test. Has had nausea and vomiting for a couple of days.

## 2014-03-17 NOTE — Discharge Instructions (Signed)

## 2014-04-05 ENCOUNTER — Encounter (HOSPITAL_COMMUNITY): Payer: Self-pay | Admitting: *Deleted

## 2014-06-05 ENCOUNTER — Observation Stay (HOSPITAL_COMMUNITY)
Admission: AD | Admit: 2014-06-05 | Discharge: 2014-06-05 | Disposition: A | Discharge status: Home / Self Care | Payer: Medicaid Other | Source: Ambulatory Visit | Attending: Obstetrics & Gynecology | Admitting: Obstetrics & Gynecology

## 2014-06-05 ENCOUNTER — Observation Stay (HOSPITAL_COMMUNITY): Payer: Medicaid Other

## 2014-06-05 ENCOUNTER — Encounter (HOSPITAL_COMMUNITY): Payer: Self-pay | Admitting: *Deleted

## 2014-06-05 DIAGNOSIS — O98312 Other infections with a predominantly sexual mode of transmission complicating pregnancy, second trimester: Secondary | ICD-10-CM | POA: Diagnosis present

## 2014-06-05 DIAGNOSIS — R636 Underweight: Secondary | ICD-10-CM

## 2014-06-05 DIAGNOSIS — R87619 Unspecified abnormal cytological findings in specimens from cervix uteri: Secondary | ICD-10-CM

## 2014-06-05 DIAGNOSIS — Z3A16 16 weeks gestation of pregnancy: Secondary | ICD-10-CM | POA: Insufficient documentation

## 2014-06-05 DIAGNOSIS — O42912 Preterm premature rupture of membranes, unspecified as to length of time between rupture and onset of labor, second trimester: Secondary | ICD-10-CM | POA: Insufficient documentation

## 2014-06-05 DIAGNOSIS — A568 Sexually transmitted chlamydial infection of other sites: Secondary | ICD-10-CM | POA: Diagnosis not present

## 2014-06-05 DIAGNOSIS — O4290 Premature rupture of membranes, unspecified as to length of time between rupture and onset of labor, unspecified weeks of gestation: Secondary | ICD-10-CM | POA: Insufficient documentation

## 2014-06-05 DIAGNOSIS — O42919 Preterm premature rupture of membranes, unspecified as to length of time between rupture and onset of labor, unspecified trimester: Secondary | ICD-10-CM | POA: Diagnosis present

## 2014-06-05 DIAGNOSIS — N83209 Unspecified ovarian cyst, unspecified side: Secondary | ICD-10-CM

## 2014-06-05 DIAGNOSIS — F419 Anxiety disorder, unspecified: Secondary | ICD-10-CM

## 2014-06-05 DIAGNOSIS — O35DXX0 Maternal care for other (suspected) fetal abnormality and damage, fetal gastrointestinal anomalies, not applicable or unspecified: Secondary | ICD-10-CM | POA: Insufficient documentation

## 2014-06-05 DIAGNOSIS — K219 Gastro-esophageal reflux disease without esophagitis: Secondary | ICD-10-CM | POA: Insufficient documentation

## 2014-06-05 DIAGNOSIS — F411 Generalized anxiety disorder: Secondary | ICD-10-CM

## 2014-06-05 DIAGNOSIS — O358XX0 Maternal care for other (suspected) fetal abnormality and damage, not applicable or unspecified: Secondary | ICD-10-CM | POA: Insufficient documentation

## 2014-06-05 DIAGNOSIS — Z862 Personal history of diseases of the blood and blood-forming organs and certain disorders involving the immune mechanism: Secondary | ICD-10-CM

## 2014-06-05 LAB — CBC WITH DIFFERENTIAL/PLATELET
Basophils Absolute: 0 10*3/uL (ref 0.0–0.1)
Basophils Relative: 0 % (ref 0–1)
EOS PCT: 1 % (ref 0–5)
Eosinophils Absolute: 0.1 10*3/uL (ref 0.0–0.7)
HCT: 33.5 % — ABNORMAL LOW (ref 36.0–46.0)
HEMOGLOBIN: 11.4 g/dL — AB (ref 12.0–15.0)
LYMPHS ABS: 0.6 10*3/uL — AB (ref 0.7–4.0)
Lymphocytes Relative: 5 % — ABNORMAL LOW (ref 12–46)
MCH: 29.7 pg (ref 26.0–34.0)
MCHC: 34 g/dL (ref 30.0–36.0)
MCV: 87.2 fL (ref 78.0–100.0)
Monocytes Absolute: 0.7 10*3/uL (ref 0.1–1.0)
Monocytes Relative: 6 % (ref 3–12)
Neutro Abs: 9.6 10*3/uL — ABNORMAL HIGH (ref 1.7–7.7)
Neutrophils Relative %: 88 % — ABNORMAL HIGH (ref 43–77)
PLATELETS: 193 10*3/uL (ref 150–400)
RBC: 3.84 MIL/uL — AB (ref 3.87–5.11)
RDW: 13.5 % (ref 11.5–15.5)
WBC: 11 10*3/uL — ABNORMAL HIGH (ref 4.0–10.5)

## 2014-06-05 LAB — AMNISURE RUPTURE OF MEMBRANE (ROM) NOT AT ARMC: Amnisure ROM: NEGATIVE

## 2014-06-05 MED ORDER — AZITHROMYCIN 500 MG PO TABS
1000.0000 mg | ORAL_TABLET | Freq: Once | ORAL | Status: AC
  Administered 2014-06-05: 1000 mg via ORAL
  Filled 2014-06-05: qty 2

## 2014-06-05 MED ORDER — CALCIUM CARBONATE ANTACID 500 MG PO CHEW
2.0000 | CHEWABLE_TABLET | ORAL | Status: DC | PRN
  Filled 2014-06-05: qty 2

## 2014-06-05 MED ORDER — ZOLPIDEM TARTRATE 5 MG PO TABS: 5.0000 mg | ORAL_TABLET | Freq: Every evening | ORAL | Status: DC | PRN

## 2014-06-05 MED ORDER — PRENATAL MULTIVITAMIN CH
1.0000 | ORAL_TABLET | Freq: Every day | ORAL | Status: DC
  Filled 2014-06-05: qty 1

## 2014-06-05 MED ORDER — IBUPROFEN 600 MG PO TABS: 600.0000 mg | ORAL_TABLET | Freq: Four times a day (QID) | ORAL | Status: DC | PRN

## 2014-06-05 MED ORDER — BUTORPHANOL TARTRATE 1 MG/ML IJ SOLN: 1.0000 mg | INTRAMUSCULAR | Status: DC | PRN

## 2014-06-05 MED ORDER — ACETAMINOPHEN 325 MG PO TABS
650.0000 mg | ORAL_TABLET | ORAL | Status: DC | PRN
  Administered 2014-06-05 (×2): 650 mg via ORAL
  Filled 2014-06-05 (×2): qty 2

## 2014-06-05 MED ORDER — AZITHROMYCIN 1 G PO PACK
1.0000 g | PACK | Freq: Once | ORAL | Status: DC
  Filled 2014-06-05: qty 1

## 2014-06-05 MED ORDER — DOCUSATE SODIUM 100 MG PO CAPS
100.0000 mg | ORAL_CAPSULE | Freq: Every day | ORAL | Status: DC
  Filled 2014-06-05: qty 1

## 2014-06-05 MED ORDER — ACETAMINOPHEN 325 MG PO TABS: 650.0000 mg | ORAL_TABLET | ORAL | Status: DC | PRN

## 2014-06-05 NOTE — Discharge Summary (Signed)
Physician Discharge Summary  Patient ID: Robin Lloyd MRN: [id number] DOB/AGE: [birth year] 23 y.o.  Admit date: 06/05/2014 Discharge date: 06/05/2014  Admission Diagnoses: Positive Crist Fat, PPROM at 16.6wks  Discharge Diagnoses:  Active Problems:   Preterm premature rupture of membranes (PPROM) with unknown onset of labor   Chlamydia contact, treated   Underweight   GERD (gastroesophageal reflux disease)   History of anemia   Anxiety state   Ovarian cyst   Abnormal Pap smear of cervix   Discharged Condition: stable  Hospital Course: PO Pain medications, Zithromax, Ultrasound  Consults: None  Significant Diagnostic Studies: labs:  Results for orders placed or performed during the hospital encounter of 06/05/14 (from the past 24 hour(s))  Amnisure rupture of membrane (rom)     Status: None   Collection Time: 06/05/14  6:57 AM  Result Value Ref Range   Amnisure ROM NEGATIVE   CBC with Differential     Status: Abnormal   Collection Time: 06/05/14  7:45 AM  Result Value Ref Range   WBC 11.0 (H) 4.0 - 10.5 K/uL   RBC 3.84 (L) 3.87 - 5.11 MIL/uL   Hemoglobin 11.4 (L) 12.0 - 15.0 g/dL   HCT 40.9 (L) 81.1 - 91.4 %   MCV 87.2 78.0 - 100.0 fL   MCH 29.7 26.0 - 34.0 pg   MCHC 34.0 30.0 - 36.0 g/dL   RDW 78.2 95.6 - 21.3 %   Platelets 193 150 - 400 K/uL   Neutrophils Relative % 88 (H) 43 - 77 %   Neutro Abs 9.6 (H) 1.7 - 7.7 K/uL   Lymphocytes Relative 5 (L) 12 - 46 %   Lymphs Abs 0.6 (L) 0.7 - 4.0 K/uL   Monocytes Relative 6 3 - 12 %   Monocytes Absolute 0.7 0.1 - 1.0 K/uL   Eosinophils Relative 1 0 - 5 %   Eosinophils Absolute 0.1 0.0 - 0.7 K/uL   Basophils Relative 0 0 - 1 %   Basophils Absolute 0.0 0.0 - 0.1 K/uL   Ultrasound Findings: Anahydramnios, SIUP at 16.6wks, EFW 192grams 0lbs 7oz, Echogenic Bowel, CL 3.1cm, Ovaries WNL  Treatments: antibiotics: azithromycin  Consent for termination signed on 06/05/14 at 1:45 pm.  Pt provided with copy.  Discharge Exam: Blood  pressure 102/67, pulse 91, temperature 98.7 F (37.1 C), temperature source Oral, resp. rate 18, weight 117 lb 4.8 oz (53.207 kg), last menstrual period 02/07/2014, SpO2 100 %. General appearance: alert, cooperative and no distress Resp: clear to auscultation bilaterally Cardio: regular rate and rhythm Extremities: extremities normal, atraumatic, no cyanosis or edema Skin: Skin color, texture, turgor normal. No rashes or lesions  Disposition: 01-Home or Self Care  *Bleeding, Fever, and Labor Precautions Given *Rx for Tylenol and Ibuprofen *Follow up in office on 06/07/2014 to schedule for D&E or IOL on Tuesday 06/08/2014  Discharge Instructions    Discharge patient    Complete by:  As directed             Medication List    TAKE these medications        acetaminophen 325 MG tablet  Commonly known as:  TYLENOL  Take 2 tablets (650 mg total) by mouth every 4 (four) hours as needed for moderate pain.     ibuprofen 600 MG tablet  Commonly known as:  ADVIL,MOTRIN  Take 1 tablet (600 mg total) by mouth every 6 (six) hours as needed.     metoCLOPramide 10 MG tablet  Commonly known as:  REGLAN  Take 1 tablet (10 mg total) by mouth 3 (three) times daily as needed for nausea.           Follow-up Information    Follow up with [facility]. Schedule an appointment as soon as possible for a visit on 06/07/2014.   Specialty:  Obstetrics and Gynecology   Why:  Please call if you have any questions or concerns prior to your next visit.   Contact information:   [address] [place] [address] [phone]      Signed: Marlene Bast 06/05/2014, 4:32 PM

## 2014-06-05 NOTE — Progress Notes (Addendum)
In to reassess pt and sign consent form. Pt comfortable s/p Tylenol.  No bleeding, fever or chills.  Pt waiting for meal to take Zithromax. Pt has decided to terminate the pregnancy.  Understands she has to wait 72 hours. Ultrasound shows viable fetus, no amniotic fluid seen. T 98.7 Gen:  Pt comfortable Abd:  Soft, nontender, decreased involuntary guarding.  A: IUP at 16 6/7 weeks SROM Suspect chronic cervicitis due to Chlamydia  P: Pt consented for D&E or IOL.  Risks,benefits, alternatives discussed at length.  All questions answered. Will check to [facility] providers Monday morning so they can set up arrangements. Pt will need f/u on Monday morning if discharged. If pt afebrile and has not progressed in labor after 12 hours, discharge home with infection and bleeding precautions. Zithromax with food. F/u GC/Chl testing. D/w CNM Gerrit Heck.

## 2014-06-05 NOTE — H&P (Signed)
Robin Lloyd is a 24 y.o. female, G1P0 @ 16.6 wks by sure LMP who presents to MAU accompanied by mother w/ c/o leaking of fluid since 3 a.m today. She presents wearing a pad that is soaked. Reports cramping in her lower abdomen yesterday, but subsided w/ rest. Denies quickening or VB. Prenatal course complicated by positive chlamydia.   Maternal Medical History:  Reason for admission: Rupture of membranes.  Cramping  Fetal activity: Perceived fetal activity is none.    Prenatal complications: No bleeding.   Prenatal Complications - Diabetes: none.    OB History    Gravida Para Term Preterm AB TAB SAB Ectopic Multiple Living   1              Past Medical History  Diagnosis Date  . GERD (gastroesophageal reflux disease)   . Anxiety   . Ovarian cyst   . Vaginal Pap smear, abnormal   . Chlamydia    Past Surgical History  Procedure Laterality Date  . No past surgeries     Family History: family history is not on file.   Paternal Grandmother- Hb SS disease, Hypertensive disorder Maternal Aunt    -History of kidney infection   - Lupus erythematosus   - Renal failure syndrome Mother - History of kidney disease   - Hypertensive disorder   - Disease of liver Maternal Grandfather - Malignant tumor of prostate Maternal Uncle - Malignant tumor of prostate Maternal Aunt - Malignant neoplastic disease - UNSURE OF SITE Unspecified Relation - Lupus erythematosus - COUSIN Father - Hypertensive disorder Maternal Grandmother - Hypertensive disorder Unspecified Relation - Seizure - COUSIN Paternal Uncle - Disease of liver Unspecified Relation - Uric acid renal calculus - COUSIN Brother - Schizophrenia Social History:  reports that she has never smoked. She does not have any smokeless tobacco history on file. She reports that she does not drink alcohol or use illicit drugs. Pt is a single [place] American female with a 12th grade education who works as a Conservation officer, nature. She is of the  [employer]. She reports heavy caffeine intake.  Prenatal Transfer Tool  Maternal Diabetes: Unknown Genetic Screening: Declined Maternal Ultrasounds/Referrals: Not done Fetal Ultrasounds or other Referrals:  None Maternal Substance Abuse:  No Significant Maternal Medications:  Meds include: Zantac Other: PNV and Tyl prn Significant Maternal Lab Results:  Lab values include: Other: NOB Hbg 11.7, PLTS 252 Other Comments:  +CT by urine on 04/27/14. Medication ordered on 05/03/14. Pt did not pick up until 05/17/14.  Review of Systems  Constitutional: Negative for fever and chills.  Gastrointestinal: Positive for abdominal pain. Negative for vomiting.      Blood pressure 103/73, pulse 108, temperature 99.2 F (37.3 C), temperature source Oral, resp. rate 16, weight 117 lb 4.8 oz (53.207 kg), last menstrual period 02/07/2014, SpO2 100 %. Maternal Exam:  Introitus: Normal vulva. Normal vagina.  Ferning test: positive.  Amniotic fluid character: purulent.  Cervix: Cervix evaluated by sterile speculum exam.    Cvx visually closed, no active bleeding.  Physical Exam  Constitutional: She is oriented to person, place, and time. She appears well-developed.  HENT:  Head: Normocephalic and atraumatic.  Neck: Normal range of motion. Neck supple.  Cardiovascular: Normal rate, regular rhythm and normal heart sounds.  Exam reveals no gallop.   No murmur heard. Respiratory: Effort normal and breath sounds normal.  GI: Soft. There is tenderness. There is no rebound and no guarding.  Musculoskeletal: Normal range of motion.  Neurological: She is  alert and oriented to person, place, and time.  Skin: Skin is warm and dry.    Results for orders placed or performed during the hospital encounter of 06/05/14 (from the past 24 hour(s))  Amnisure rupture of membrane (rom)     Status: None   Collection Time: 06/05/14  6:57 AM  Result Value Ref Range   Amnisure ROM NEGATIVE   CBC with  Differential     Status: Abnormal   Collection Time: 06/05/14  7:45 AM  Result Value Ref Range   WBC 11.0 (H) 4.0 - 10.5 K/uL   RBC 3.84 (L) 3.87 - 5.11 MIL/uL   Hemoglobin 11.4 (L) 12.0 - 15.0 g/dL   HCT 16.1 (L) 09.6 - 04.5 %   MCV 87.2 78.0 - 100.0 fL   MCH 29.7 26.0 - 34.0 pg   MCHC 34.0 30.0 - 36.0 g/dL   RDW 40.9 81.1 - 91.4 %   Platelets 193 150 - 400 K/uL   Neutrophils Relative % 88 (H) 43 - 77 %   Neutro Abs 9.6 (H) 1.7 - 7.7 K/uL   Lymphocytes Relative 5 (L) 12 - 46 %   Lymphs Abs 0.6 (L) 0.7 - 4.0 K/uL   Monocytes Relative 6 3 - 12 %   Monocytes Absolute 0.7 0.1 - 1.0 K/uL   Eosinophils Relative 1 0 - 5 %   Eosinophils Absolute 0.1 0.0 - 0.7 K/uL   Basophils Relative 0 0 - 1 %   Basophils Absolute 0.0 0.0 - 0.1 K/uL    Prenatal labs: ABO, Rh:  B+ (04/27/14) Antibody:  Neg (04/27/14) Rubella:  Immune (04/27/14) RPR:  NR (04/27/14) HBsAg:  Neg (04/27/14) HIV:   Neg (04/27/14) GBS:  Unknown  FHTs: 169-170 bpm  Assessment: IUP at 16.6 wks PPROM (+pooling, +valsalva, +fern)  Plan: Admit to [facility] for observation per consultation w/ Dr. Dion Body CBC w/ diff Expectant management for now   Sherre Scarlet 06/05/2014, 8:48 AM

## 2014-06-05 NOTE — Discharge Instructions (Signed)
Miscarriage A miscarriage is the sudden loss of an unborn baby (fetus) before the 20th week of pregnancy. Most miscarriages happen in the first 3 months of pregnancy. Sometimes, it happens before a woman even knows she is pregnant. A miscarriage is also called a "spontaneous miscarriage" or "early pregnancy loss." Having a miscarriage can be an emotional experience. Talk with your caregiver about any questions you may have about miscarrying, the grieving process, and your future pregnancy plans. CAUSES   Problems with the fetal chromosomes that make it impossible for the baby to develop normally. Problems with the baby's genes or chromosomes are most often the result of errors that occur, by chance, as the embryo divides and grows. The problems are not inherited from the parents.  Infection of the cervix or uterus.   Hormone problems.   Problems with the cervix, such as having an incompetent cervix. This is when the tissue in the cervix is not strong enough to hold the pregnancy.   Problems with the uterus, such as an abnormally shaped uterus, uterine fibroids, or congenital abnormalities.   Certain medical conditions.   Smoking, drinking alcohol, or taking illegal drugs.   Trauma.  Often, the cause of a miscarriage is unknown.  SYMPTOMS   Vaginal bleeding or spotting, with or without cramps or pain.  Pain or cramping in the abdomen or lower back.  Passing fluid, tissue, or blood clots from the vagina. DIAGNOSIS  Your caregiver will perform a physical exam. You may also have an ultrasound to confirm the miscarriage. Blood or urine tests may also be ordered. TREATMENT   Sometimes, treatment is not necessary if you naturally pass all the fetal tissue that was in the uterus. If some of the fetus or placenta remains in the body (incomplete miscarriage), tissue left behind may become infected and must be removed. Usually, a dilation and curettage (D and C) procedure is performed.  During a D and C procedure, the cervix is widened (dilated) and any remaining fetal or placental tissue is gently removed from the uterus.  Antibiotic medicines are prescribed if there is an infection. Other medicines may be given to reduce the size of the uterus (contract) if there is a lot of bleeding.  If you have Rh negative blood and your baby was Rh positive, you will need a Rh immunoglobulin shot. This shot will protect any future baby from having Rh blood problems in future pregnancies. HOME CARE INSTRUCTIONS   Your caregiver may order bed rest or may allow you to continue light activity. Resume activity as directed by your caregiver.  Have someone help with home and family responsibilities during this time.   Keep track of the number of sanitary pads you use each day and how soaked (saturated) they are. Write down this information.   Do not use tampons. Do not douche or have sexual intercourse until approved by your caregiver.   Only take over-the-counter or prescription medicines for pain or discomfort as directed by your caregiver.   Do not take aspirin. Aspirin can cause bleeding.   Keep all follow-up appointments with your caregiver.   If you or your partner have problems with grieving, talk to your caregiver or seek counseling to help cope with the pregnancy loss. Allow enough time to grieve before trying to get pregnant again.  SEEK IMMEDIATE MEDICAL CARE IF:   You have severe cramps or pain in your back or abdomen.  You have a fever.  You pass large blood clots (walnut-sized   or larger) ortissue from your vagina. Save any tissue for your caregiver to inspect.   Your bleeding increases.   You have a thick, bad-smelling vaginal discharge.  You become lightheaded, weak, or you faint.   You have chills.  MAKE SURE YOU:  Understand these instructions.  Will watch your condition.  Will get help right away if you are not doing well or get  worse. Document Released: 11/14/2000 Document Revised: 09/15/2012 Document Reviewed: 07/10/2011 ExitCare Patient Information 2015 [employer]. This information is not intended to replace advice given to you by your health care provider. Make sure you discuss any questions you have with your health care provider.  

## 2014-06-05 NOTE — Progress Notes (Signed)
Addendum to cosigned H&P.  GC/Chl testing by urine prior to treatment with Zithromax.

## 2014-06-05 NOTE — Progress Notes (Signed)
Discharge instructions reviewed with patient.  Patient states understanding of home care, medications, activity, signs/symptoms to report to MD and return MD office visit.  Patients mother and family will assist with her care @ home.  No home equipment needed.  Patient has personal belongings.  Patient  discharged in stable per wheelchair  with staff without incident. 

## 2014-06-05 NOTE — MAU Note (Signed)
Pt states feeling cramping pain in abdomen yesterday and states she woke up around 0300 with leaking of fluid.  Pt denies vaginal bleeding.

## 2014-06-06 DIAGNOSIS — O35DXX0 Maternal care for other (suspected) fetal abnormality and damage, fetal gastrointestinal anomalies, not applicable or unspecified: Secondary | ICD-10-CM | POA: Insufficient documentation

## 2014-06-06 DIAGNOSIS — O4290 Premature rupture of membranes, unspecified as to length of time between rupture and onset of labor, unspecified weeks of gestation: Secondary | ICD-10-CM | POA: Insufficient documentation

## 2014-06-06 DIAGNOSIS — O358XX0 Maternal care for other (suspected) fetal abnormality and damage, not applicable or unspecified: Secondary | ICD-10-CM | POA: Insufficient documentation

## 2014-06-06 DIAGNOSIS — Z3A16 16 weeks gestation of pregnancy: Secondary | ICD-10-CM | POA: Insufficient documentation

## 2014-06-07 ENCOUNTER — Other Ambulatory Visit: Payer: Self-pay | Admitting: Obstetrics and Gynecology

## 2014-06-07 NOTE — H&P (Incomplete)
Robin Lloyd is a 24 y.o. female, G1P0 at 74 2/7  weeks, presenting for induction of labor due to SROM on 06/05/14.  She was evaluated at [facility] on 06/05/14, with verification of anhydramnios.  No onset of labor occurred during a 12 hour observation period.  Patient was counseled regarding the likelihood of poor pregnancy outcome, and options for continued observation vs termination of pregnancy were discussed by Dr. Dion Body.  Patient elected to proceed with termination of the pregnancy, and the required 72 hour waiting period was begun.    Ultrasound Findings on 06/05/14:  Anhydramnios, SIUP at 16.6wks, EFW 192grams 0lbs 7oz, echogenic bowel, CL 3.1cm, ovaries WNL, breech.  She reports continued leaking and occasional cramping, denies bleeding.  Hx + chlamydia 04/27/14, no treatment until around 12/14.  Repeat GC/chlamydia done 1/2--results pending as of 06/07/13 at 4:33p.  Patient Active Problem List   Diagnosis Date Noted  . ROM (rupture of membranes), premature   . Echogenic focus of bowel of fetal affecting antepartum care of mother   . [redacted] weeks gestation of pregnancy   . Preterm premature rupture of membranes (PPROM) with unknown onset of labor 06/05/2014  . Chlamydia contact, treated 06/05/2014  . Underweight 06/05/2014  . GERD (gastroesophageal reflux disease) 06/05/2014  . History of anemia 06/05/2014  . Anxiety state 06/05/2014  . Ovarian cyst 06/05/2014  . Abnormal Pap smear of cervix 06/05/2014    History of present pregnancy: Patient entered care at 12 3/7 weeks.   EDC of  11/14/14 was established by sure LMP . Had NOB w/u on 12/2--had hx of + chlamydia dx 11/24 at NOB interview, but did not take med until around 12/14.  Last evaluation 06/05/14 at [facility]:  Cervix visually closed on exam.  Given 1 gm Zmax to ensure adequate treatment of chlamydia.   OB History    Gravida Para Term Preterm AB TAB SAB Ectopic Multiple Living   1              Past Medical History  Diagnosis Date   . GERD (gastroesophageal reflux disease)   . Anxiety   . Ovarian cyst   . Vaginal Pap smear, abnormal   . Chlamydia    Past Surgical History  Procedure Laterality Date  . No past surgeries      Family Hx: Paternal Grandmother- Hb SS disease, Hypertensive disorder Maternal Aunt  -History of kidney infection - Lupus erythematosus - Renal failure syndrome Mother- History of kidney disease - Hypertensive disorder - Disease of liver Maternal Grandfather- Malignant tumor of prostate Maternal Uncle- Malignant tumor of prostate Maternal Aunt- Malignant neoplastic disease - UNSURE OF SITE Unspecified Relation- Lupus erythematosus - [place] Father- Hypertensive disorder Maternal Grandmother- Hypertensive disorder Unspecified Relation- Seizure - [place] Paternal Uncle- Disease of liver Unspecified Relation- Uric acid renal calculus - [place] Brother- Schizophrenia   Social History:  reports that she has never smoked. She does not have any smokeless tobacco history on file. She reports that she does not drink alcohol or use illicit drugs. Pt is a single [place] American female with a 12th grade education who works as a Conservation officer, nature. She is of the [employer]. She reports heavy caffeine intake.  FOB is active duty Hotel manager, stationed out of state.   ROS:  ***  No Known Allergies     Last menstrual period 02/07/2014.  Chest clear Heart RRR without murmur Abd gravid, NT, FH *** Pelvic: *** Ext: ***  Prenatal labs: ABO, Rh:  B+ (04/27/14) Antibody:  Neg (04/27/14) Rubella:  Immune (04/27/14) RPR:  NR (04/27/14) HBsAg:  Neg (04/27/14) HIV:   Neg (04/27/14) GBS:  Unknown    Assessment/Plan: IUP at 17 2/7 weeks PPROM since 06/05/14 Desires termination of pregnancy in light of poor prognosis.  Plan: Admit to AICU per consult with Dr. Su Hilt Induction  of labor with Cytotech Pain medication prn Support to patient for pregnancy loss    Nyra Capes, [place] 06/07/2014, 3:32 PM

## 2014-06-08 ENCOUNTER — Inpatient Hospital Stay (HOSPITAL_COMMUNITY)
Admission: AD | Admit: 2014-06-08 | Discharge: 2014-06-09 | DRG: 779 | Disposition: A | Discharge status: Home / Self Care | Payer: Medicaid Other | Source: Ambulatory Visit | Attending: Obstetrics and Gynecology | Admitting: Obstetrics and Gynecology

## 2014-06-08 ENCOUNTER — Encounter (HOSPITAL_COMMUNITY): Payer: Self-pay | Admitting: Anesthesiology

## 2014-06-08 ENCOUNTER — Encounter (HOSPITAL_COMMUNITY): Payer: Self-pay | Admitting: *Deleted

## 2014-06-08 DIAGNOSIS — O99344 Other mental disorders complicating childbirth: Secondary | ICD-10-CM | POA: Diagnosis present

## 2014-06-08 DIAGNOSIS — O9962 Diseases of the digestive system complicating childbirth: Secondary | ICD-10-CM | POA: Diagnosis present

## 2014-06-08 DIAGNOSIS — O42919 Preterm premature rupture of membranes, unspecified as to length of time between rupture and onset of labor, unspecified trimester: Secondary | ICD-10-CM | POA: Diagnosis present

## 2014-06-08 DIAGNOSIS — Z332 Encounter for elective termination of pregnancy: Secondary | ICD-10-CM | POA: Diagnosis present

## 2014-06-08 DIAGNOSIS — Z3A17 17 weeks gestation of pregnancy: Secondary | ICD-10-CM | POA: Diagnosis present

## 2014-06-08 DIAGNOSIS — F411 Generalized anxiety disorder: Secondary | ICD-10-CM | POA: Diagnosis present

## 2014-06-08 DIAGNOSIS — K219 Gastro-esophageal reflux disease without esophagitis: Secondary | ICD-10-CM | POA: Diagnosis present

## 2014-06-08 DIAGNOSIS — O0289 Other abnormal products of conception: Secondary | ICD-10-CM | POA: Diagnosis present

## 2014-06-08 DIAGNOSIS — O98819 Other maternal infectious and parasitic diseases complicating pregnancy, unspecified trimester: Secondary | ICD-10-CM | POA: Diagnosis present

## 2014-06-08 DIAGNOSIS — A749 Chlamydial infection, unspecified: Secondary | ICD-10-CM | POA: Diagnosis present

## 2014-06-08 DIAGNOSIS — O42912 Preterm premature rupture of membranes, unspecified as to length of time between rupture and onset of labor, second trimester: Secondary | ICD-10-CM | POA: Diagnosis present

## 2014-06-08 LAB — CBC WITH DIFFERENTIAL/PLATELET
BASOS ABS: 0 10*3/uL (ref 0.0–0.1)
Basophils Relative: 0 % (ref 0–1)
Eosinophils Absolute: 0.1 10*3/uL (ref 0.0–0.7)
Eosinophils Relative: 2 % (ref 0–5)
HCT: 31.9 % — ABNORMAL LOW (ref 36.0–46.0)
Hemoglobin: 10.6 g/dL — ABNORMAL LOW (ref 12.0–15.0)
LYMPHS ABS: 0.9 10*3/uL (ref 0.7–4.0)
LYMPHS PCT: 12 % (ref 12–46)
MCH: 29 pg (ref 26.0–34.0)
MCHC: 33.2 g/dL (ref 30.0–36.0)
MCV: 87.4 fL (ref 78.0–100.0)
Monocytes Absolute: 0.6 10*3/uL (ref 0.1–1.0)
Monocytes Relative: 8 % (ref 3–12)
Neutro Abs: 5.6 10*3/uL (ref 1.7–7.7)
Neutrophils Relative %: 78 % — ABNORMAL HIGH (ref 43–77)
Platelets: 180 10*3/uL (ref 150–400)
RBC: 3.65 MIL/uL — AB (ref 3.87–5.11)
RDW: 13.2 % (ref 11.5–15.5)
WBC: 7.2 10*3/uL (ref 4.0–10.5)

## 2014-06-08 LAB — GC/CHLAMYDIA PROBE AMP
CT Probe RNA: POSITIVE — AB
GC Probe RNA: NEGATIVE

## 2014-06-08 LAB — TYPE AND SCREEN
ABO/RH(D): B POS
Antibody Screen: NEGATIVE

## 2014-06-08 LAB — MRSA PCR SCREENING: MRSA BY PCR: NEGATIVE

## 2014-06-08 LAB — ABO/RH: ABO/RH(D): B POS

## 2014-06-08 LAB — RPR

## 2014-06-08 MED ORDER — LIDOCAINE HCL (PF) 1 % IJ SOLN
30.0000 mL | INTRAMUSCULAR | Status: DC | PRN
  Filled 2014-06-08: qty 30

## 2014-06-08 MED ORDER — ACETAMINOPHEN 325 MG PO TABS: 650.0000 mg | ORAL_TABLET | ORAL | Status: DC | PRN

## 2014-06-08 MED ORDER — OXYTOCIN BOLUS FROM INFUSION
500.0000 mL | INTRAVENOUS | Status: DC
  Administered 2014-06-09: 500 mL via INTRAVENOUS

## 2014-06-08 MED ORDER — OXYCODONE-ACETAMINOPHEN 5-325 MG PO TABS: 2.0000 | ORAL_TABLET | ORAL | Status: DC | PRN

## 2014-06-08 MED ORDER — MISOPROSTOL 200 MCG PO TABS
400.0000 ug | ORAL_TABLET | ORAL | Status: DC | PRN
  Administered 2014-06-08 – 2014-06-09 (×3): 400 ug via VAGINAL
  Filled 2014-06-08 (×4): qty 2

## 2014-06-08 MED ORDER — LACTATED RINGERS IV SOLN
INTRAVENOUS | Status: DC
  Administered 2014-06-08 – 2014-06-09 (×2): via INTRAVENOUS

## 2014-06-08 MED ORDER — ONDANSETRON HCL 4 MG/2ML IJ SOLN
4.0000 mg | Freq: Four times a day (QID) | INTRAMUSCULAR | Status: DC | PRN
  Administered 2014-06-09: 4 mg via INTRAVENOUS
  Filled 2014-06-08: qty 2

## 2014-06-08 MED ORDER — FENTANYL CITRATE 0.05 MG/ML IJ SOLN
50.0000 ug | INTRAMUSCULAR | Status: DC | PRN
  Administered 2014-06-08: 50 ug via INTRAVENOUS
  Filled 2014-06-08: qty 2

## 2014-06-08 MED ORDER — OXYCODONE-ACETAMINOPHEN 5-325 MG PO TABS
1.0000 | ORAL_TABLET | ORAL | Status: DC | PRN
  Administered 2014-06-08 (×2): 1 via ORAL
  Filled 2014-06-08 (×2): qty 1

## 2014-06-08 MED ORDER — LACTATED RINGERS IV SOLN: 500.0000 mL | INTRAVENOUS | Status: DC | PRN

## 2014-06-08 MED ORDER — TERBUTALINE SULFATE 1 MG/ML IJ SOLN: 0.2500 mg | Freq: Once | INTRAMUSCULAR | Status: DC | PRN

## 2014-06-08 MED ORDER — BUTORPHANOL TARTRATE 1 MG/ML IJ SOLN
2.0000 mg | INTRAMUSCULAR | Status: DC | PRN
  Administered 2014-06-08: 2 mg via INTRAVENOUS
  Filled 2014-06-08: qty 2

## 2014-06-08 MED ORDER — CITRIC ACID-SODIUM CITRATE 334-500 MG/5ML PO SOLN: 30.0000 mL | ORAL | Status: DC | PRN

## 2014-06-08 MED ORDER — OXYTOCIN 40 UNITS IN LACTATED RINGERS INFUSION - SIMPLE MED
62.5000 mL/h | INTRAVENOUS | Status: DC
  Filled 2014-06-08 (×2): qty 1000

## 2014-06-08 MED ORDER — ZOLPIDEM TARTRATE 5 MG PO TABS: 5.0000 mg | ORAL_TABLET | Freq: Every evening | ORAL | Status: DC | PRN

## 2014-06-08 MED ORDER — FLEET ENEMA 7-19 GM/118ML RE ENEM: 1.0000 | ENEMA | RECTAL | Status: DC | PRN

## 2014-06-08 NOTE — H&P (Signed)
Robin Lloyd is a 24 y.o. female, G1P0 at 4217 2/7  weeks, presenting for induction of labor due to SROM on 06/05/14.  [facility], with verification of anhydramnios.  No onset of labor occurred during a 12 hour observation period.  Patient was counseled regarding the likelihood of poor pregnancy outcome, and options for continued observation vs termination of pregnancy were discussed by Dr. Dion BodyVarnado.  Patient elected to proceed with termination of the pregnancy, and the required 72 hour waiting period was begun, completed now.  Ultrasound Findings on 06/05/14:  Anhydramnios, SIUP at 16.6wks, EFW 192grams 0lbs 7oz, echogenic bowel, CL 3.1cm, ovaries WNL, breech.  She reports occasional leaking and occasional cramping, denies bleeding.  Hx + chlamydia 04/27/14, no treatment until around 12/14.  Repeat GC/chlamydia done 1/2, with persistent positive chlamydia resulted today.  Patient was treated with azithromycin 1 gm on 06/05/14.  Mother present with her today.  Patient Active Problem List   Diagnosis Date Noted  . Chlamydia infection affecting pregnancy--dx 11/24, no treatment until 12/14, still positive 1/2--treated 1/2 in MAU. 06/08/2014  . ROM (rupture of membranes), premature   . Echogenic focus of bowel of fetal affecting antepartum care of mother   . [redacted] weeks gestation of pregnancy   . Preterm premature rupture of membranes (PPROM) with unknown onset of labor 06/05/2014  . Underweight 06/05/2014  . GERD (gastroesophageal reflux disease) 06/05/2014  . History of anemia 06/05/2014  . Anxiety state 06/05/2014  . Ovarian cyst 06/05/2014  . Abnormal Pap smear of cervix 06/05/2014    History of present pregnancy: Patient entered care at 12 3/7 weeks.   EDC of  11/14/14 was established by sure LMP . Had NOB w/u on 12/2--had hx of + chlamydia dx 11/24 at NOB interview, but did not take med until around 12/14.  [facility]:  Cervix visually closed on  exam.  Given 1 gm Zmax to ensure adequate treatment of chlamydia.   OB History    Gravida Para Term Preterm AB TAB SAB Ectopic Multiple Living   1              Past Medical History  Diagnosis Date  . GERD (gastroesophageal reflux disease)   . Anxiety   . Ovarian cyst   . Vaginal Pap smear, abnormal   . Chlamydia    Past Surgical History  Procedure Laterality Date  . No past surgeries      Family Hx: Paternal Grandmother- Hb SS disease, Hypertensive disorder Maternal Aunt  -History of kidney infection - Lupus erythematosus - Renal failure syndrome Mother- History of kidney disease - Hypertensive disorder - Disease of liver Maternal Grandfather- Malignant tumor of prostate Maternal Uncle- Malignant tumor of prostate Maternal Aunt- Malignant neoplastic disease - UNSURE OF SITE Unspecified Relation- Lupus erythematosus - [place] Father- Hypertensive disorder Maternal Grandmother- Hypertensive disorder Unspecified Relation- Seizure - [place] Paternal Uncle- Disease of liver Unspecified Relation- Uric acid renal calculus - [place] Brother- Schizophrenia   Social History:  reports that she has never smoked. She does not have any smokeless tobacco history on file. She reports that she does not drink alcohol or use illicit drugs.  Pt is a single [place] female with a 12th grade education who works as a Conservation officer, naturecashier. She is of the [employer]. She reports heavy caffeine intake.  FOB is active duty Hotel managermilitary, stationed out of state.   ROS:  Mild cramping, minimal leaking.  No Known Allergies  Height  (1.575 m), last menstrual period 02/07/2014.  Chest clear Heart RRR without murmur Abd gravid, NT, FH 16 week size Pelvic: cervix closed, long.  Small amount pink d/c on exam. Ext: WNL  400 mcg Cytotech placed in posterior  fornix.  Prenatal labs: ABO, Rh:  B+ (04/27/14) Antibody:  Neg (04/27/14) Rubella:  Immune (04/27/14) RPR:  NR (04/27/14) HBsAg:  Neg (04/27/14) HIV:   Neg (04/27/14) GBS:  Unknown GC negative 11/24 and 1/2 Chlamydia positive 11/24 and 1/2 Hgb 11.4 on 06/05/14 WBC ct 11 on 1/2, neutrophils 88. Platelets 193    Assessment/Plan: IUP at 17 2/7 weeks PPROM since 06/05/14 Desires termination of pregnancy in light of pre-viable PPROM and expected poor outcome--has completed 72 hour waiting period.  Plan: Admit to AICU per consult with Dr. Su Hilt Induction of labor with Cytotech Pain medication prn Support to patient for pregnancy loss Patient prefers not to see fetus after delivery.  Patient's mother does want to see the baby. Patient and mother decline chaplaincy services. Consent form completed for use of Cytotech for induction of labor for pre-viable fetus s/p prolonged premature rupture of membranes. R&B reviewed, including bleeding, infection, need for manual removal of placenta discussed.    LATHAM, VICKICNM, [place] 06/08/2014, 3:21 PM    Agree with above

## 2014-06-08 NOTE — Progress Notes (Signed)
Post discharge chart review completed.  

## 2014-06-08 NOTE — Progress Notes (Signed)
Robin Lloyd MRN: [id number]  Subjective: -Nurse instructed to place next dose of cytotec at 2000. Reports patient having occasional cramping and took percocet, but no IV pain medications.    Objective: BP 100/64 mmHg  Pulse 79  Temp(Src) 98.3 F (36.8 C) (Oral)  Resp 18  Ht 5\' 2"  (1.575 m)  Wt 115 lb 8 oz (52.39 kg)  BMI 21.12 kg/m2  SpO2 100%  LMP 02/07/2014 I/O last 3 completed shifts: In: -  Out: 100 [Urine:100]     Assessment:  IUP at 17.2wks PPROM Induction of Labor   Plan: -Nurse to place cytotec 400mcg PV at 2000 -Will assess patient at later time -Continue other mgmt as ordered  [facility], Robin Tabora LYNN,MSN, CNM 06/08/2014, 7:43 PM

## 2014-06-08 NOTE — Progress Notes (Signed)
Pt was lying in bed and awake when I arrived. While talking w/pt's mom, while nurses were working w/pt, I discovered mom was concerned about pt's health now and after this delivery. I also learned about their faith background and that pt's grandmother is also a Optician, dispensingminister (w/whom I am acquainted) and we spoke on the phone. Pt said she was fine and she felt ok about everything. Mom was a little tearful during our visit and while I offered words of encouragement to pt. Honoring mother's concerns, had prayer w/pt and mother. Pls pg if additional spt is needed tonight. Marjory Liesamela Carrington Holder Chaplain   06/08/14 1700  Clinical Encounter Type  Visited With Patient and family together  Visit Type Initial;Other (Comment)

## 2014-06-09 DIAGNOSIS — O42919 Preterm premature rupture of membranes, unspecified as to length of time between rupture and onset of labor, unspecified trimester: Secondary | ICD-10-CM

## 2014-06-09 DIAGNOSIS — O0289 Other abnormal products of conception: Secondary | ICD-10-CM | POA: Diagnosis present

## 2014-06-09 LAB — HIV ANTIBODY (ROUTINE TESTING W REFLEX)
HIV 1/HIV 2 AB: NONREACTIVE
HIV 1/O/2 Abs-Index Value: <1 (ref <1.00)

## 2014-06-09 LAB — CBC
HEMATOCRIT: 29.7 % — AB (ref 36.0–46.0)
Hemoglobin: 10.3 g/dL — ABNORMAL LOW (ref 12.0–15.0)
MCH: 30.1 pg (ref 26.0–34.0)
MCHC: 34.7 g/dL (ref 30.0–36.0)
MCV: 86.8 fL (ref 78.0–100.0)
Platelets: 201 10*3/uL (ref 150–400)
RBC: 3.42 MIL/uL — AB (ref 3.87–5.11)
RDW: 13.1 % (ref 11.5–15.5)
WBC: 10.7 10*3/uL — ABNORMAL HIGH (ref 4.0–10.5)

## 2014-06-09 MED ORDER — IBUPROFEN 600 MG PO TABS
600.0000 mg | ORAL_TABLET | Freq: Four times a day (QID) | ORAL | Status: DC | PRN
  Administered 2014-06-09: 600 mg via ORAL
  Filled 2014-06-09: qty 1

## 2014-06-09 MED ORDER — MEDROXYPROGESTERONE ACETATE 150 MG/ML IM SUSP: 150.0000 mg | Freq: Once | INTRAMUSCULAR | Status: DC

## 2014-06-09 NOTE — Progress Notes (Signed)
2nd Entry After 1st Note Deleted  Robin Lloyd MRN: [id number]  Subjective: -Patient reports lower abdominal and suprapubic pain that she rates as 10/10.  She received one dose stadol, but reports that she does not like "the way it makes me feel."    Objective: BP 106/75 mmHg  Pulse 79  Temp(Src) 98.2 F (37.2 C) (Oral)  Resp 18  Ht 5\' 2"  (1.575 m)  Wt 115 lb 8 oz (52.39 kg)  BMI 21.12 kg/m2  SpO2 98%  LMP 02/07/2014  SVE:   Dilation:  1 Effacement (%): 50 Station: -2 Bloody Show Exam by:: J.Evart Mcdonnell Membranes: Intact Pitocin: None Cytotec: 3rd Dose Placed  Assessment:  IUP at 17.3wks PPROM IOL  Plan: -Cytotec placed  -Discussed possibility of delivering without provider or nurse present and before 10cm -Patient continues to not want to see fetus, but states mother does -Fentanyl 50 mcg ordered and given. May consider increase to 100 mcg if necessary. -Continue other mgmt as ordered  [name],MSN, CNM 06/09/2014, 3:44 AM

## 2014-06-09 NOTE — Progress Notes (Signed)
S: Nurse call provider at 0110 and report that patient delivered.  Reports that patient c/o pressure, tightening, and then had sudden "pop."  Nurse states at 0108 non-viable female fetus spontaneously delivered in vertex presentation.  Fetus removed from room prior to provider arrival.   O: Provider in room at 0112. Umbilical cord noted to be clamped.  75mL clot noted on peri-pad.  Fundus at U/-2.  Fundal massage given. Bleeding stable.  Placenta spontaneously delivered at 0117. VSS. Bleeding remained stable-scant. EBL 200mL. Fundus firm and 3FB above the symphysis pubis. Patient requests and allowed to wash up at bedside sink with provider assistance. Reporting some minor dizziness and requests juice.  Mother remained at bedside, supportive.  Patient declines chaplain or other support.   A: Fetal Demise at 17.3wks Hemodynamically Stable  P: Routine PP Orders Placenta and fetus to surgical pathology  Hagan Maltz LYNN, CNM

## 2014-06-09 NOTE — Progress Notes (Signed)
Attempted follow-up care this morning but pt was sleeping.  As she was seen by Spiritual Care last night, I asked her nurse to page me if pt needed or wanted additional support this morning.  Dyanne CarrelKaty Beila Purdie Pager, [phone] 11:41 AM    06/09/14 1100  Clinical Encounter Type  Visited With Patient not available;Health care provider

## 2014-06-09 NOTE — Progress Notes (Signed)
Subjective: Postpartum Day 0: Vaginal delivery of non-viable fetus, s/p PPROM since 06/05/14. Patient up ad lib, reports no syncope or dizziness.  "Just feel tired". Reports mild cramping, has declined meds. Plans Depo for contraception--will receive at f/u office visit in 2 weeks. Very stable emotionally--wants to RTW on Monday, 06/14/14.  Has sit-down job as Conservation officer, naturecashier.   Objective: Vital signs in last 24 hours: Temp:  [98.2 F (36.8 C)-99.2 F (37.3 C)] 98.2 F (36.8 C) (01/06 0806) Pulse Rate:  [74-95] 85 (01/06 0806) Resp:  [16-20] 16 (01/06 0806) BP: (96-118)/(47-80) 101/64 mmHg (01/06 0806) SpO2:  [98 %-100 %] 99 % (01/06 0806) Weight:  [115 lb 8 oz (52.39 kg)] 115 lb 8 oz (52.39 kg) (01/05 1659)  Physical Exam:  General: alert Lochia: minimal Uterine Fundus: firm Perineum: Intact DVT Evaluation: No evidence of DVT seen on physical exam. Negative Homan's sign.    Recent Labs  06/08/14 1605 06/09/14 0745  HGB 10.6* 10.3*  HCT 31.9* 29.7*    Assessment/Plan: Status post vaginal delivery day 0--pre-viable fetus, s/p PPROM x 4 days. Stable Will have patient eat breakfast. Plan d/c today. Support to patient for loss. [facility] with Depo that day.  Nyra CapesLATHAM, VICKICNM 06/09/2014, 8:43 AM

## 2014-06-09 NOTE — Progress Notes (Signed)
Discharge instructions reviewed with patient and patient's support person at the bedside. Pt verbalized understanding and had no other questions. Pt request to ambulate to car with support person and RN. Pt stable upon discharge.

## 2014-06-09 NOTE — Progress Notes (Signed)
Pt. uncomfortable with pressure and tightening, sudden awareness of "pop" and spontaneous delivery of non viable female infant at [id number].  Vertex presentation.  No fluid noted.  Pt. looked down briefly but requested not to see baby.  Mother looked at baby.  Cord clamped, placenta undelivered.  Report of delivery to Sabas SousJ. Emly, CNM.

## 2014-06-09 NOTE — Progress Notes (Signed)
UR chart review completed.  

## 2014-06-09 NOTE — Discharge Instructions (Signed)
Call for any fever, severe pain, or heavy bleeding.  Call if temperature greater than or equal to 100.4. Take Ibuprophen or Tylenol for pain as needed.  Pick up Depo Provera medication from pharmacy before coming to your office visit in 2 weeks.  We will give the medication to you at your visit. The office will call you to schedule follow-up visit for 2 weeks.

## 2014-08-11 NOTE — Discharge Summary (Signed)
Physician Discharge Summary  Patient ID: Robin Lloyd MRN: [id number] DOB/AGE: 10/21/1990 24 y.o.  Admit date: 06/08/2014 Discharge date: 06/09/14 Admission Diagnoses:  PPROM at 16 6/7 weeks  Discharge Diagnoses:  Principal Problem:   Preterm premature rupture of membranes (PPROM) with unknown onset of labor Active Problems:   Chlamydia infection affecting pregnancy--dx 11/24, no treatment until 12/14, still positive 1/2--treated 1/2 in MAU, still positive on 06/05/14 test.   Non-viable pregnancy   Discharged Condition: stable  Hospital Course: Dx with SROM on 06/05/14, with verification of SROM/anhydramnios.  Options were reviewed with the patient, with her making the decision on 06/05/14 for induction of labor to begin after 72 hour waiting period.  She was therefore admitted on 06/08/14, with induction by Cytotech.  She had also been treated for persistent chlamydia on 06/05/14.  She received 2 doses of  Cytotech, with subsequent delivery of a non-viable female fetus on 06/09/14.  Placenta delivered spontaneously.  Patient remained stable throughout the delivery and recovery phase.  She desired d/c later that day, and was sent home with Rx for Depo Provera for contraception.  Chaplain visit was offered, and was declined by patient.  Placenta and fetus to pathology.  Consults: None  Significant Diagnostic Studies: labs:  Recent Results (from the past 2160 hour(s))  Amnisure rupture of membrane (rom)     Status: None   Collection Time: 06/05/14  6:57 AM  Result Value Ref Range   Amnisure ROM NEGATIVE   CBC with Differential     Status: Abnormal   Collection Time: 06/05/14  7:45 AM  Result Value Ref Range   WBC 11.0 (H) 4.0 - 10.5 K/uL   RBC 3.84 (L) 3.87 - 5.11 MIL/uL   Hemoglobin 11.4 (L) 12.0 - 15.0 g/dL   HCT 69.6 (L) 29.5 - 28.4 %   MCV 87.2 78.0 - 100.0 fL   MCH 29.7 26.0 - 34.0 pg   MCHC 34.0 30.0 - 36.0 g/dL   RDW 13.2 44.0 - 10.2 %   Platelets 193 150 - 400 K/uL   Neutrophils  Relative % 88 (H) 43 - 77 %   Neutro Abs 9.6 (H) 1.7 - 7.7 K/uL   Lymphocytes Relative 5 (L) 12 - 46 %   Lymphs Abs 0.6 (L) 0.7 - 4.0 K/uL   Monocytes Relative 6 3 - 12 %   Monocytes Absolute 0.7 0.1 - 1.0 K/uL   Eosinophils Relative 1 0 - 5 %   Eosinophils Absolute 0.1 0.0 - 0.7 K/uL   Basophils Relative 0 0 - 1 %   Basophils Absolute 0.0 0.0 - 0.1 K/uL  GC/Chlamydia Probe Amp (multiple spec sources)     Status: Abnormal   Collection Time: 06/05/14  9:30 AM  Result Value Ref Range   CT Probe RNA POSITIVE (A) NEGATIVE    Comment: (NOTE) A Positive CT or NG Nucleic Acid Amplification Test (NAAT) result should be considered presumptive evidence of infection.  The result should be evaluated along with physical examination and other diagnostic findings.    GC Probe RNA NEGATIVE NEGATIVE    Comment: (NOTE)                                                                                       **  Normal Reference Range: Negative**      Assay performed using the Gen-Probe APTIMA COMBO2 (R) Assay. Acceptable specimen types for this assay include APTIMA Swabs (Unisex, endocervical, urethral, or vaginal), first void urine, and ThinPrep liquid based cytology samples. Performed at [facility]   MRSA PCR Screening     Status: None   Collection Time: 06/08/14  4:00 PM  Result Value Ref Range   MRSA by PCR NEGATIVE NEGATIVE    Comment:        The GeneXpert MRSA Assay (FDA approved for NASAL specimens only), is one component of a comprehensive MRSA colonization surveillance program. It is not intended to diagnose MRSA infection nor to guide or monitor treatment for MRSA infections.   RPR     Status: None   Collection Time: 06/08/14  4:05 PM  Result Value Ref Range   RPR Ser Ql NON REAC NON REAC    Comment: Performed at [facility]  CBC with Differential     Status: Abnormal   Collection Time: 06/08/14  4:05 PM  Result Value Ref Range   WBC 7.2 4.0 - 10.5 K/uL    RBC 3.65 (L) 3.87 - 5.11 MIL/uL   Hemoglobin 10.6 (L) 12.0 - 15.0 g/dL   HCT 16.1 (L) 09.6 - 04.5 %   MCV 87.4 78.0 - 100.0 fL   MCH 29.0 26.0 - 34.0 pg   MCHC 33.2 30.0 - 36.0 g/dL   RDW 40.9 81.1 - 91.4 %   Platelets 180 150 - 400 K/uL   Neutrophils Relative % 78 (H) 43 - 77 %   Neutro Abs 5.6 1.7 - 7.7 K/uL   Lymphocytes Relative 12 12 - 46 %   Lymphs Abs 0.9 0.7 - 4.0 K/uL   Monocytes Relative 8 3 - 12 %   Monocytes Absolute 0.6 0.1 - 1.0 K/uL   Eosinophils Relative 2 0 - 5 %   Eosinophils Absolute 0.1 0.0 - 0.7 K/uL   Basophils Relative 0 0 - 1 %   Basophils Absolute 0.0 0.0 - 0.1 K/uL  Type and screen     Status: None   Collection Time: 06/08/14  4:05 PM  Result Value Ref Range   ABO/RH(D) B POS    Antibody Screen NEG    Sample Expiration 06/11/2014   HIV antibody     Status: None   Collection Time: 06/08/14  4:05 PM  Result Value Ref Range   HIV 1/O/2 Abs-Index Value <1.00 <1.00    Comment: Index Value: Specimen reactivity relative to the negative cutoff.   HIV-1/HIV-2 Ab Non Reactive Non Reactive    Comment: (NOTE) Performed At: [facility] [address] [place], [place] [id number] Mila Homer MD YQ:[id number]   ABO/Rh     Status: None   Collection Time: 06/08/14  4:05 PM  Result Value Ref Range   ABO/RH(D) B POS   CBC     Status: Abnormal   Collection Time: 06/09/14  7:45 AM  Result Value Ref Range   WBC 10.7 (H) 4.0 - 10.5 K/uL   RBC 3.42 (L) 3.87 - 5.11 MIL/uL   Hemoglobin 10.3 (L) 12.0 - 15.0 g/dL   HCT 52.8 (L) 41.3 - 24.4 %   MCV 86.8 78.0 - 100.0 fL   MCH 30.1 26.0 - 34.0 pg   MCHC 34.7 30.0 - 36.0 g/dL   RDW 01.0 27.2 - 53.6 %   Platelets 201 150 - 400 K/uL    Treatments: None  Discharge Exam: Blood pressure 97/56, pulse 88, temperature 98.9 F (37.2 C), temperature source Oral, resp. rate 16, height 5\' 2"  (1.575 m), weight 115 lb 8 oz (52.39 kg), last menstrual period 02/07/2014, SpO2 100 %. General appearance: alert Resp:  clear to auscultation bilaterally Breasts: normal appearance, no masses or tenderness Cardio: regular rate and rhythm, S1, S2 normal, no murmur, click, rub or gallop Pelvic: cervix normal in appearance, external genitalia normal, no adnexal masses or tenderness, no cervical motion tenderness, rectovaginal septum normal, uterus normal size, shape, and consistency and vagina normal without discharge Extremities: extremities normal, atraumatic, no cyanosis or edema  Minimal lochia noted  Disposition: 01-Home or Self Care     Medication List    STOP taking these medications        metoCLOPramide 10 MG tablet  Commonly known as:  REGLAN      TAKE these medications        acetaminophen 325 MG tablet  Commonly known as:  TYLENOL  Take 2 tablets (650 mg total) by mouth every 4 (four) hours as needed for moderate pain.     ibuprofen 600 MG tablet  Commonly known as:  ADVIL,MOTRIN  Take 1 tablet (600 mg total) by mouth every 6 (six) hours as needed.     medroxyPROGESTERone 150 MG/ML injection  Commonly known as:  DEPO-PROVERA  Inject 1 mL (150 mg total) into the muscle once.     ranitidine 150 MG tablet  Commonly known as:  ZANTAC  Take 150 mg by mouth 2 (two) times daily as needed for heartburn.           Follow-up Information    [facility] weeks.   Specialty:  Obstetrics and Gynecology   Why:  Office will call you to schedule f/u visit in 2 weeks.  Call for any heavy bleeding, fever, severe pain, or any other concerns.   Contact information:   [address] [place] [id number] [phone]      Signed: Nigel BridgemanLATHAM, Javarion Douty 08/11/14 11a

## 2015-01-20 ENCOUNTER — Encounter (HOSPITAL_COMMUNITY): Payer: Self-pay | Admitting: *Deleted

## 2015-03-29 ENCOUNTER — Emergency Department (HOSPITAL_BASED_OUTPATIENT_CLINIC_OR_DEPARTMENT_OTHER)
Admission: EM | Admit: 2015-03-29 | Discharge: 2015-03-29 | Disposition: A | Discharge status: Home / Self Care | Payer: Medicaid Other | Attending: Emergency Medicine | Admitting: Emergency Medicine

## 2015-03-29 ENCOUNTER — Encounter (HOSPITAL_BASED_OUTPATIENT_CLINIC_OR_DEPARTMENT_OTHER): Payer: Self-pay | Admitting: *Deleted

## 2015-03-29 DIAGNOSIS — Z8619 Personal history of other infectious and parasitic diseases: Secondary | ICD-10-CM | POA: Insufficient documentation

## 2015-03-29 DIAGNOSIS — K219 Gastro-esophageal reflux disease without esophagitis: Secondary | ICD-10-CM | POA: Insufficient documentation

## 2015-03-29 DIAGNOSIS — L509 Urticaria, unspecified: Secondary | ICD-10-CM | POA: Insufficient documentation

## 2015-03-29 DIAGNOSIS — Z8659 Personal history of other mental and behavioral disorders: Secondary | ICD-10-CM | POA: Insufficient documentation

## 2015-03-29 DIAGNOSIS — Z8742 Personal history of other diseases of the female genital tract: Secondary | ICD-10-CM | POA: Insufficient documentation

## 2015-03-29 MED ORDER — PREDNISONE 50 MG PO TABS
60.0000 mg | ORAL_TABLET | Freq: Once | ORAL | Status: AC
  Administered 2015-03-29: 60 mg via ORAL
  Filled 2015-03-29 (×2): qty 1

## 2015-03-29 MED ORDER — DIPHENHYDRAMINE HCL 25 MG PO TABS: 25.0000 mg | ORAL_TABLET | ORAL | Status: DC | PRN

## 2015-03-29 MED ORDER — RANITIDINE HCL 150 MG/10ML PO SYRP
150.0000 mg | ORAL_SOLUTION | Freq: Once | ORAL | Status: AC
  Administered 2015-03-29: 150 mg via ORAL
  Filled 2015-03-29: qty 10

## 2015-03-29 MED ORDER — PREDNISONE 20 MG PO TABS: ORAL_TABLET | ORAL | Status: DC

## 2015-03-29 MED ORDER — DIPHENHYDRAMINE HCL 25 MG PO CAPS
50.0000 mg | ORAL_CAPSULE | Freq: Once | ORAL | Status: AC
  Administered 2015-03-29: 50 mg via ORAL
  Filled 2015-03-29: qty 2

## 2015-03-29 NOTE — ED Notes (Signed)
Hives since last night. Itching.

## 2015-03-29 NOTE — ED Notes (Signed)
Patient ambulatory and stable.  Patient verbalizes understanding of discharge medications and instructions.

## 2015-03-29 NOTE — ED Provider Notes (Signed)
CSN: [id number]     Arrival date & time 03/29/15  1525 History   First MD Initiated Contact with Patient 03/29/15 1558     Chief Complaint  Patient presents with  . Urticaria     (Consider location/radiation/quality/duration/timing/severity/associated sxs/prior Treatment) Patient is a 24 y.o. female presenting with rash.  Rash Location:  Full body Severity:  Mild Onset quality:  Gradual Duration:  1 day Timing:  Constant Progression:  Spreading Chronicity:  New Context: new detergent/soap   Context: not animal contact   Associated symptoms: no fever, no nausea, no shortness of breath and not vomiting     Past Medical History  Diagnosis Date  . GERD (gastroesophageal reflux disease)   . Anxiety   . Ovarian cyst   . Vaginal Pap smear, abnormal   . Chlamydia    Past Surgical History  Procedure Laterality Date  . No past surgeries     No family history on file. Social History  Substance Use Topics  . Smoking status: Never Smoker   . Smokeless tobacco: None  . Alcohol Use: No     Comment: occas.   OB History    Gravida Para Term Preterm AB TAB SAB Ectopic Multiple Living   1              Review of Systems  Constitutional: Negative for fever and chills.  HENT: Negative for congestion, drooling and mouth sores.   Eyes: Negative for pain.  Respiratory: Negative for cough, shortness of breath and stridor.   Cardiovascular: Negative for chest pain and palpitations.  Gastrointestinal: Negative for nausea, vomiting and rectal pain.  Genitourinary: Negative for flank pain.  Skin: Positive for rash. Negative for color change and pallor.  All other systems reviewed and are negative.     Allergies  Review of patient's allergies indicates no known allergies.  Home Medications   Prior to Admission medications   Medication Sig Start Date End Date Taking? Authorizing Provider  diphenhydrAMINE (BENADRYL) 25 MG tablet Take 1 tablet (25 mg total) by mouth every 4  (four) hours as needed for itching. 03/29/15   Marily Memos, MD  medroxyPROGESTERone (DEPO-PROVERA) 150 MG/ML injection Inject 1 mL (150 mg total) into the muscle once. 06/21/14   Nigel Bridgeman, CNM  predniSONE (DELTASONE) 20 MG tablet 3 tabs po day one, then 2 po daily x 4 days, then 1 po daily x 2 days 03/29/15   Marily Memos, MD  ranitidine (ZANTAC) 150 MG tablet Take 150 mg by mouth 2 (two) times daily as needed for heartburn.    Historical Provider, MD   BP 106/80 mmHg  Pulse 87  Temp(Src) 98.6 F (37 C) (Oral)  Resp 16  Ht  (1.575 m)  Wt 125 lb (56.7 kg)  BMI 22.86 kg/m2  SpO2 100%  LMP 03/07/2015 Physical Exam  Constitutional: She is oriented to person, place, and time. She appears well-developed and well-nourished.  HENT:  Head: Normocephalic and atraumatic.  Eyes: Conjunctivae and EOM are normal. Right eye exhibits no discharge. Left eye exhibits no discharge.  Cardiovascular: Normal rate and regular rhythm.   Pulmonary/Chest: Effort normal and breath sounds normal. No respiratory distress.  Abdominal: Soft. She exhibits no distension. There is no tenderness. There is no rebound.  Musculoskeletal: Normal range of motion. She exhibits no edema or tenderness.  Neurological: She is alert and oriented to person, place, and time.  Skin: Skin is warm and dry. Rash (hives and welps throughout body, not on face  or neck. also with excoriations) noted.  Nursing note and vitals reviewed.   ED Course  Procedures (including critical care time) Labs Review Labs Reviewed - No data to display  Imaging Review No results found. I have personally reviewed and evaluated these images and lab results as part of my medical decision-making.   EKG Interpretation None      MDM   Final diagnoses:  Hives   Hives, unknown allergen, possibly bath soap. Will tx accordingly. No other systems to suggest anaphylaxis.   On reevaluation patient with improved symptoms. We'll DC on steroid  taper and suggest avoiding allergen.  I have personally and contemperaneously reviewed labs and imaging and used in my decision making as above.   A medical screening exam was performed and I feel the patient has had an appropriate workup for their chief complaint at this time and likelihood of emergent condition existing is low. They have been counseled on decision, discharge, follow up and which symptoms necessitate immediate return to the emergency department. They or their family verbally stated understanding and agreement with plan and discharged in stable condition.      Marily MemosJason Adriona Kaney, MD 03/29/15 2213

## 2015-03-29 NOTE — ED Notes (Signed)
Patient states she didn't have her oatmeal soap and got some bubble bath at [employer] and used it last night.  She stated having a rash and itching around 5 to 6am this morning.

## 2015-06-08 ENCOUNTER — Emergency Department (HOSPITAL_BASED_OUTPATIENT_CLINIC_OR_DEPARTMENT_OTHER)
Admission: EM | Admit: 2015-06-08 | Discharge: 2015-06-08 | Disposition: A | Discharge status: Home / Self Care | Payer: Medicaid Other | Attending: Emergency Medicine | Admitting: Emergency Medicine

## 2015-06-08 ENCOUNTER — Encounter (HOSPITAL_BASED_OUTPATIENT_CLINIC_OR_DEPARTMENT_OTHER): Payer: Self-pay | Admitting: *Deleted

## 2015-06-08 ENCOUNTER — Emergency Department (HOSPITAL_BASED_OUTPATIENT_CLINIC_OR_DEPARTMENT_OTHER): Payer: Medicaid Other

## 2015-06-08 DIAGNOSIS — N760 Acute vaginitis: Secondary | ICD-10-CM | POA: Insufficient documentation

## 2015-06-08 DIAGNOSIS — K5900 Constipation, unspecified: Secondary | ICD-10-CM | POA: Insufficient documentation

## 2015-06-08 DIAGNOSIS — B9689 Other specified bacterial agents as the cause of diseases classified elsewhere: Secondary | ICD-10-CM

## 2015-06-08 DIAGNOSIS — Z8659 Personal history of other mental and behavioral disorders: Secondary | ICD-10-CM | POA: Insufficient documentation

## 2015-06-08 DIAGNOSIS — R110 Nausea: Secondary | ICD-10-CM | POA: Insufficient documentation

## 2015-06-08 DIAGNOSIS — Z8619 Personal history of other infectious and parasitic diseases: Secondary | ICD-10-CM | POA: Insufficient documentation

## 2015-06-08 DIAGNOSIS — R1084 Generalized abdominal pain: Secondary | ICD-10-CM

## 2015-06-08 DIAGNOSIS — Z3202 Encounter for pregnancy test, result negative: Secondary | ICD-10-CM | POA: Insufficient documentation

## 2015-06-08 LAB — CBC WITH DIFFERENTIAL/PLATELET
Basophils Absolute: 0 10*3/uL (ref 0.0–0.1)
Basophils Relative: 1 %
EOS PCT: 2 %
Eosinophils Absolute: 0.1 10*3/uL (ref 0.0–0.7)
HEMATOCRIT: 36.5 % (ref 36.0–46.0)
HEMOGLOBIN: 11.9 g/dL — AB (ref 12.0–15.0)
LYMPHS ABS: 1 10*3/uL (ref 0.7–4.0)
LYMPHS PCT: 27 %
MCH: 27 pg (ref 26.0–34.0)
MCHC: 32.6 g/dL (ref 30.0–36.0)
MCV: 82.8 fL (ref 78.0–100.0)
MONO ABS: 0.3 10*3/uL (ref 0.1–1.0)
Monocytes Relative: 7 %
NEUTROS ABS: 2.3 10*3/uL (ref 1.7–7.7)
Neutrophils Relative %: 63 %
PLATELETS: 279 10*3/uL (ref 150–400)
RBC: 4.41 MIL/uL (ref 3.87–5.11)
RDW: 14.5 % (ref 11.5–15.5)
WBC: 4 10*3/uL (ref 4.0–10.5)

## 2015-06-08 LAB — URINALYSIS, ROUTINE W REFLEX MICROSCOPIC
Glucose, UA: NEGATIVE mg/dL
Ketones, ur: 15 mg/dL — AB
LEUKOCYTES UA: NEGATIVE
Nitrite: NEGATIVE
PROTEIN: NEGATIVE mg/dL
Specific Gravity, Urine: 1.025 (ref 1.005–1.030)
pH: 6 (ref 5.0–8.0)

## 2015-06-08 LAB — COMPREHENSIVE METABOLIC PANEL
ALBUMIN: 4 g/dL (ref 3.5–5.0)
ALT: 8 U/L — AB (ref 14–54)
ANION GAP: 5 (ref 5–15)
AST: 20 U/L (ref 15–41)
Alkaline Phosphatase: 37 U/L — ABNORMAL LOW (ref 38–126)
BUN: 10 mg/dL (ref 6–20)
CHLORIDE: 107 mmol/L (ref 101–111)
CO2: 25 mmol/L (ref 22–32)
CREATININE: 0.64 mg/dL (ref 0.44–1.00)
Calcium: 9.3 mg/dL (ref 8.9–10.3)
GFR calc non Af Amer: >60 mL/min (ref >60)
Glucose, Bld: 82 mg/dL (ref 65–99)
Potassium: 3.8 mmol/L (ref 3.5–5.1)
SODIUM: 137 mmol/L (ref 135–145)
Total Bilirubin: 0.3 mg/dL (ref 0.3–1.2)
Total Protein: 7.5 g/dL (ref 6.5–8.1)

## 2015-06-08 LAB — URINE MICROSCOPIC-ADD ON

## 2015-06-08 LAB — WET PREP, GENITAL
Sperm: NONE SEEN
Trich, Wet Prep: NONE SEEN
YEAST WET PREP: NONE SEEN

## 2015-06-08 LAB — HCG, SERUM, QUALITATIVE: PREG SERUM: NEGATIVE

## 2015-06-08 LAB — LIPASE, BLOOD: Lipase: 22 U/L (ref 11–51)

## 2015-06-08 MED ORDER — DOCUSATE SODIUM 100 MG PO CAPS: 100.0000 mg | ORAL_CAPSULE | Freq: Two times a day (BID) | ORAL | Status: DC

## 2015-06-08 MED ORDER — POLYETHYLENE GLYCOL 3350 17 GM/SCOOP PO POWD: 17.0000 g | Freq: Once | ORAL | Status: DC

## 2015-06-08 MED ORDER — METRONIDAZOLE 500 MG PO TABS: 500.0000 mg | ORAL_TABLET | Freq: Two times a day (BID) | ORAL | Status: DC

## 2015-06-08 MED FILL — metroNIDAZOLE 500 MG TABS: 500 | 7 days supply | Qty: 14 | Fill #0

## 2015-06-08 MED FILL — POLYETHYLENE GLYCOL 3350 PO: 10 days supply | Qty: 238 | Fill #0

## 2015-06-08 MED FILL — DOK 100 MG SOFTGEL: 100 | 50 days supply | Qty: 100 | Fill #0

## 2015-06-08 NOTE — ED Notes (Signed)
C/o mid abd pain x several months and shooting pains thru rectum. States her usual BM's are hard and once a week. No fever.

## 2015-06-08 NOTE — ED Provider Notes (Signed)
CSN: [id number]     Arrival date & time 06/08/15  0807 History   First MD Initiated Contact with Patient 06/08/15 0845     No chief complaint on file.    (Consider location/radiation/quality/duration/timing/severity/associated sxs/prior Treatment) HPI 25 year old female who presents with abdominal pain and "knots in her stomach". States shooting pain in her rectum. Reports constipation for a few months. States she has a bowel movement at most 1-2 times a week. Pain also sharp, on and off, and ongoing for the past several months as well. States that she has been eating more and gaining weight. Feel nausea sometimes but no vomiting.  No abnormal vaginal bleeding.  No fever or chills, and no urinary complaints. Also reports vaginal discharge that comes and goes, that is thick, but states that this is an unrelated matter.   Past Medical History  Diagnosis Date  . GERD (gastroesophageal reflux disease)   . Anxiety   . Ovarian cyst   . Vaginal Pap smear, abnormal   . Chlamydia    Past Surgical History  Procedure Laterality Date  . No past surgeries     History reviewed. No pertinent family history. Social History  Substance Use Topics  . Smoking status: Never Smoker   . Smokeless tobacco: None  . Alcohol Use: No     Comment: occas.   OB History    Gravida Para Term Preterm AB TAB SAB Ectopic Multiple Living   1              Review of Systems 10/14 systems reviewed and are negative other than those stated in the HPI   Allergies  Review of patient's allergies indicates no known allergies.  Home Medications   Prior to Admission medications   Medication Sig Start Date End Date Taking? Authorizing Provider  docusate sodium (COLACE) 100 MG capsule Take 1 capsule (100 mg total) by mouth every 12 (twelve) hours. 06/08/15   Lavera Guiseana Duo Bebe Moncure, MD  metroNIDAZOLE (FLAGYL) 500 MG tablet Take 1 tablet (500 mg total) by mouth 2 (two) times daily. 06/08/15   Lavera Guiseana Duo Tahnee Cifuentes, MD  polyethylene glycol  powder (GLYCOLAX/MIRALAX) powder Take 17 g by mouth once. Dissolve 1 capful of powder into any liquid and drink once daily. If no results after 3-5 days, take twice daily 06/08/15   Lavera Guiseana Duo Amatullah Christy, MD   BP 118/87 mmHg  Pulse 74  Temp(Src) 98 F (36.7 C) (Oral)  Resp 16  Ht 5\' 2"  (1.575 m)  Wt 132 lb 9.6 oz (60.147 kg)  BMI 24.25 kg/m2  SpO2 100%  LMP 06/06/2015 Physical Exam Physical Exam  Nursing note and vitals reviewed. Constitutional: Well developed, well nourished, non-toxic, and in no acute distress Head: Normocephalic and atraumatic.  Mouth/Throat: Oropharynx is clear and moist.  Neck: Normal range of motion. Neck supple.  Cardiovascular: Normal rate and regular rhythm.   Pulmonary/Chest: Effort normal and breath sounds normal.  Abdominal: Soft. No distension. There is mild generalized tenderness. There is no rebound and no guarding.  Pelvic: Normal external genitalia. Normal internal genitalia. Frothy white vaginal discharge. No blood within the vagina. No cervical motion tenderness. No adnexal masses or tenderness. Musculoskeletal: Normal range of motion.  Neurological: Alert, no facial droop, fluent speech, moves all extremities symmetrically Skin: Skin is warm and dry.  Psychiatric: Cooperative  ED Course  Procedures (including critical care time) Labs Review Labs Reviewed  WET PREP, GENITAL - Abnormal; Notable for the following:    Clue Cells Wet  Prep HPF POC PRESENT (*)    WBC, Wet Prep HPF POC MANY (*)    All other components within normal limits  CBC WITH DIFFERENTIAL/PLATELET - Abnormal; Notable for the following:    Hemoglobin 11.9 (*)    All other components within normal limits  COMPREHENSIVE METABOLIC PANEL - Abnormal; Notable for the following:    ALT 8 (*)    Alkaline Phosphatase 37 (*)    All other components within normal limits  URINALYSIS, ROUTINE W REFLEX MICROSCOPIC (NOT AT [facility]) - Abnormal; Notable for the following:    Hgb urine dipstick LARGE (*)     Bilirubin Urine SMALL (*)    Ketones, ur 15 (*)    All other components within normal limits  URINE MICROSCOPIC-ADD ON - Abnormal; Notable for the following:    Squamous Epithelial / LPF 0-5 (*)    Bacteria, UA MANY (*)    All other components within normal limits  LIPASE, BLOOD  HCG, SERUM, QUALITATIVE  GC/CHLAMYDIA PROBE AMP ([place]) NOT AT [facility]    Imaging Review Dg Abd 2 Views  06/08/2015  CLINICAL DATA:  Diffuse abdominal pain for several days. Constipation EXAM: ABDOMEN - 2 VIEW COMPARISON:  None. FINDINGS: Moderate stool volume with formed stool mainly distending the proximal transverse and ascending colon. Negative for bowel obstruction or perforation. No concerning intra-abdominal mass effect or calcification. Lung bases are clear. IMPRESSION: Moderate stool volume without obstruction. Electronically Signed   By: Marnee Spring M.D.   On: 06/08/2015 11:34   I have personally reviewed and evaluated these images and lab results as part of my medical decision-making.   EKG Interpretation None      MDM   Final diagnoses:  Constipation  Bacterial vaginosis  Generalized abdominal pain    25 year old female who presents with intermittent abdominal cramping with constipation. VS stable and abdomen benign. Generalized tenderness but not suspicion for serious infectious or surgical intraabdominal process. Basic blood work overall unremarkable and UA/preg overall negative. Xr with moderate fecal loading which is most likely cause of abdominal discomfort. Started on bowel regimen including colace and miralax.  Also c/o vaginal discharge which she states is unrelated. Pelvic exam with BV, will tx with course of flagyl. No pelvic tenderness or CMT. Do no think intrapelvic process causing symptoms.   Strict return and follow-up instructions reviewed. She expressed understanding of all discharge instructions and felt comfortable with the plan of care.   Lavera Guise,  MD 06/08/15 2120

## 2015-06-08 NOTE — Discharge Instructions (Signed)
Take medications as prescribed. Your abdominal pain is likely from constipation today. We do not find any other cause of your symptoms. Please return without fail for worsening symptoms, including worsening pain, fevers, vomiting and unable to keep down food/fluids, or any other symptoms concerning to you.  Abdominal Pain, Adult Many things can cause belly (abdominal) pain. Most times, the belly pain is not dangerous. Many cases of belly pain can be watched and treated at home. HOME CARE   Do not take medicines that help you go poop (laxatives) unless told to by your doctor.  Only take medicine as told by your doctor.  Eat or drink as told by your doctor. Your doctor will tell you if you should be on a special diet. GET HELP IF:  You do not know what is causing your belly pain.  You have belly pain while you are sick to your stomach (nauseous) or have runny poop (diarrhea).  You have pain while you pee or poop.  Your belly pain wakes you up at night.  You have belly pain that gets worse or better when you eat.  You have belly pain that gets worse when you eat fatty foods.  You have a fever. GET HELP RIGHT AWAY IF:   The pain does not go away within 2 hours.  You keep throwing up (vomiting).  The pain changes and is only in the right or left part of the belly.  You have bloody or tarry looking poop. MAKE SURE YOU:   Understand these instructions.  Will watch your condition.  Will get help right away if you are not doing well or get worse.   This information is not intended to replace advice given to you by your health care provider. Make sure you discuss any questions you have with your health care provider.   Document Released: 11/07/2007 Document Revised: 06/11/2014 Document Reviewed: 01/28/2013 Elsevier Interactive Patient Education 2016 Elsevier Inc.  Bacterial Vaginosis Bacterial vaginosis is an infection of the vagina. It happens when too many germs (bacteria)  grow in the vagina. Having this infection puts you at risk for getting other infections from sex. Treating this infection can help lower your risk for other infections, such as:   Chlamydia.  Gonorrhea.  HIV.  Herpes. HOME CARE  Take your medicine as told by your doctor.  Finish your medicine even if you start to feel better.  Tell your sex partner that you have an infection. They should see their doctor for treatment.  During treatment:  Avoid sex or use condoms correctly.  Do not douche.  Do not drink alcohol unless your doctor tells you it is ok.  Do not breastfeed unless your doctor tells you it is ok. GET HELP IF:  You are not getting better after 3 days of treatment.  You have more grey fluid (discharge) coming from your vagina than before.  You have more pain than before.  You have a fever. MAKE SURE YOU:   Understand these instructions.  Will watch your condition.  Will get help right away if you are not doing well or get worse.   This information is not intended to replace advice given to you by your health care provider. Make sure you discuss any questions you have with your health care provider.   Document Released: 02/28/2008 Document Revised: 06/11/2014 Document Reviewed: 12/31/2012 Elsevier Interactive Patient Education 2016 Elsevier Inc.  High-Fiber Diet Fiber, also called dietary fiber, is a type of carbohydrate found in  fruits, vegetables, whole grains, and beans. A high-fiber diet can have many health benefits. Your health care provider may recommend a high-fiber diet to help:  Prevent constipation. Fiber can make your bowel movements more regular.  Lower your cholesterol.  Relieve hemorrhoids, uncomplicated diverticulosis, or irritable bowel syndrome.  Prevent overeating as part of a weight-loss plan.  Prevent heart disease, type 2 diabetes, and certain cancers. WHAT IS MY PLAN? The recommended daily intake of fiber includes:  38  grams for men under age 11.  30 grams for men over age 58.  25 grams for women under age 40.  21 grams for women over age 1. You can get the recommended daily intake of dietary fiber by eating a variety of fruits, vegetables, grains, and beans. Your health care provider may also recommend a fiber supplement if it is not possible to get enough fiber through your diet. WHAT DO I NEED TO KNOW ABOUT A HIGH-FIBER DIET?  Fiber supplements have not been widely studied for their effectiveness, so it is better to get fiber through food sources.  Always check the fiber content on thenutrition facts label of any prepackaged food. Look for foods that contain at least 5 grams of fiber per serving.  Ask your dietitian if you have questions about specific foods that are related to your condition, especially if those foods are not listed in the following section.  Increase your daily fiber consumption gradually. Increasing your intake of dietary fiber too quickly may cause bloating, cramping, or gas.  Drink plenty of water. Water helps you to digest fiber. WHAT FOODS CAN I EAT? Grains Whole-grain breads. Multigrain cereal. Oats and oatmeal. Brown rice. Barley. Bulgur wheat. Millet. Bran muffins. Popcorn. Rye wafer crackers. Vegetables Sweet potatoes. Spinach. Kale. Artichokes. Cabbage. Broccoli. Green peas. Carrots. Squash. Fruits Berries. Pears. Apples. Oranges. Avocados. Prunes and raisins. Dried figs. Meats and Other Protein Sources Navy, kidney, pinto, and soy beans. Split peas. Lentils. Nuts and seeds. Dairy Fiber-fortified yogurt. Beverages Fiber-fortified soy milk. Fiber-fortified orange juice. Other Fiber bars. The items listed above may not be a complete list of recommended foods or beverages. Contact your dietitian for more options. WHAT FOODS ARE NOT RECOMMENDED? Grains White bread. Pasta made with refined flour. White rice. Vegetables Fried potatoes. Canned vegetables.  Well-cooked vegetables.  Fruits Fruit juice. Cooked, strained fruit. Meats and Other Protein Sources Fatty cuts of meat. Fried Environmental education officer or fried fish. Dairy Milk. Yogurt. Cream cheese. Sour cream. Beverages Soft drinks. Other Cakes and pastries. Butter and oils. The items listed above may not be a complete list of foods and beverages to avoid. Contact your dietitian for more information. WHAT ARE SOME TIPS FOR INCLUDING HIGH-FIBER FOODS IN MY DIET?  Eat a wide variety of high-fiber foods.  Make sure that half of all grains consumed each day are whole grains.  Replace breads and cereals made from refined flour or white flour with whole-grain breads and cereals.  Replace white rice with brown rice, bulgur wheat, or millet.  Start the day with a breakfast that is high in fiber, such as a cereal that contains at least 5 grams of fiber per serving.  Use beans in place of meat in soups, salads, or pasta.  Eat high-fiber snacks, such as berries, raw vegetables, nuts, or popcorn.   This information is not intended to replace advice given to you by your health care provider. Make sure you discuss any questions you have with your health care provider.   Document Released:  05/21/2005 Document Revised: 06/11/2014 Document Reviewed: 11/03/2013 Elsevier Interactive Patient Education [employer].

## 2015-06-09 LAB — GC/CHLAMYDIA PROBE AMP (~~LOC~~) NOT AT ARMC
Chlamydia: NEGATIVE
Neisseria Gonorrhea: NEGATIVE

## 2015-06-23 ENCOUNTER — Emergency Department (HOSPITAL_COMMUNITY)
Admission: EM | Admit: 2015-06-23 | Discharge: 2015-06-23 | Disposition: A | Discharge status: Home / Self Care | Payer: Medicaid Other | Attending: Emergency Medicine | Admitting: Emergency Medicine

## 2015-06-23 ENCOUNTER — Encounter (HOSPITAL_COMMUNITY): Payer: Self-pay | Admitting: Emergency Medicine

## 2015-06-23 DIAGNOSIS — Z79899 Other long term (current) drug therapy: Secondary | ICD-10-CM | POA: Insufficient documentation

## 2015-06-23 DIAGNOSIS — Z792 Long term (current) use of antibiotics: Secondary | ICD-10-CM | POA: Insufficient documentation

## 2015-06-23 DIAGNOSIS — Z8659 Personal history of other mental and behavioral disorders: Secondary | ICD-10-CM | POA: Insufficient documentation

## 2015-06-23 DIAGNOSIS — Z8742 Personal history of other diseases of the female genital tract: Secondary | ICD-10-CM | POA: Insufficient documentation

## 2015-06-23 DIAGNOSIS — K029 Dental caries, unspecified: Secondary | ICD-10-CM | POA: Insufficient documentation

## 2015-06-23 DIAGNOSIS — Z8619 Personal history of other infectious and parasitic diseases: Secondary | ICD-10-CM | POA: Insufficient documentation

## 2015-06-23 DIAGNOSIS — K047 Periapical abscess without sinus: Secondary | ICD-10-CM | POA: Insufficient documentation

## 2015-06-23 DIAGNOSIS — K0889 Other specified disorders of teeth and supporting structures: Secondary | ICD-10-CM

## 2015-06-23 MED ORDER — HYDROCODONE-ACETAMINOPHEN 5-325 MG PO TABS: 2.0000 | ORAL_TABLET | ORAL | Status: DC | PRN

## 2015-06-23 MED ORDER — AMOXICILLIN 500 MG PO CAPS
500.0000 mg | ORAL_CAPSULE | Freq: Three times a day (TID) | ORAL | Status: DC
Start: 2015-06-23 — End: 2015-07-28

## 2015-06-23 MED ORDER — DICLOFENAC SODIUM 50 MG PO TBEC: 50.0000 mg | DELAYED_RELEASE_TABLET | Freq: Two times a day (BID) | ORAL | Status: DC

## 2015-06-23 MED ORDER — HYDROCODONE-ACETAMINOPHEN 5-325 MG PO TABS
2.0000 | ORAL_TABLET | Freq: Once | ORAL | Status: AC
  Administered 2015-06-23: 2 via ORAL
  Filled 2015-06-23: qty 2

## 2015-06-23 NOTE — ED Provider Notes (Signed)
CSN: [id number]     Arrival date & time 06/23/15  1208 History  By signing my name below, I, Tanda Rockers, attest that this documentation has been prepared under the direction and in the presence of Langston Masker, [place].  Electronically Signed: Tanda Rockers, ED Scribe. 06/23/2015. 12:42 PM.   Chief Complaint  Patient presents with  . Dental Pain   The history is provided by the patient. No language interpreter was used.     HPI Comments: Robin Lloyd is a 25 y.o. female who presents to the Emergency Department complaining of gradual onset, constant, moderate, bilateral lower dental pain x 1 week. Pt states she has an abscess on the left lower side and a broken tooth on the right lower side. She has been taking Ibuprofen without relief. Mom states that pt has not eaten in 3 days due to the pain. Pt does not currently have a dentist. Denies fever, chills, shortness of breath, or any other associated symptoms.   Past Medical History  Diagnosis Date  . GERD (gastroesophageal reflux disease)   . Anxiety   . Ovarian cyst   . Vaginal Pap smear, abnormal   . Chlamydia    Past Surgical History  Procedure Laterality Date  . No past surgeries     History reviewed. No pertinent family history. Social History  Substance Use Topics  . Smoking status: Never Smoker   . Smokeless tobacco: None  . Alcohol Use: No     Comment: occas.   OB History    Gravida Para Term Preterm AB TAB SAB Ectopic Multiple Living   1              Review of Systems  Constitutional: Negative for fever and chills.  HENT: Positive for dental problem.   Respiratory: Negative for shortness of breath.   All other systems reviewed and are negative.  Allergies  Review of patient's allergies indicates no known allergies.  Home Medications   Prior to Admission medications   Medication Sig Start Date End Date Taking? Authorizing Provider  docusate sodium (COLACE) 100 MG capsule Take 1 capsule (100 mg total) by  mouth every 12 (twelve) hours. 06/08/15   Lavera Guise, MD  metroNIDAZOLE (FLAGYL) 500 MG tablet Take 1 tablet (500 mg total) by mouth 2 (two) times daily. 06/08/15   Lavera Guise, MD  polyethylene glycol powder (GLYCOLAX/MIRALAX) powder Take 17 g by mouth once. Dissolve 1 capful of powder into any liquid and drink once daily. If no results after 3-5 days, take twice daily 06/08/15   Lavera Guise, MD   Triage Vitals:  BP 139/92 mmHg  Pulse 88  Temp(Src) 98.2 F (36.8 C) (Oral)  Resp 18  SpO2 100%  LMP 06/06/2015   Physical Exam  Constitutional: She is oriented to person, place, and time. She appears well-developed and well-nourished. No distress.  HENT:  Head: Normocephalic and atraumatic.  Decayed first molars right and left lower mouth Swelling around left lower molar with open draining abscess site  Eyes: Conjunctivae and EOM are normal.  Neck: Neck supple. No tracheal deviation present.  Cardiovascular: Normal rate.   Pulmonary/Chest: Effort normal. No respiratory distress.  Musculoskeletal: Normal range of motion.  Neurological: She is alert and oriented to person, place, and time.  Skin: Skin is warm and dry.  Psychiatric: She has a normal mood and affect. Her behavior is normal.  Nursing note and vitals reviewed.   ED Course  Procedures (including critical care time)  DIAGNOSTIC STUDIES: Oxygen Saturation is 100% on RA, normal by my interpretation.    COORDINATION OF CARE: 12:35 PM-Discussed treatment plan which includes Rx antibiotics with pt at bedside and pt agreed to plan.   Labs Review Labs Reviewed - No data to display  Imaging Review No results found.   EKG Interpretation None      MDM   Final diagnoses:  Toothache    Meds ordered this encounter  Medications  . diclofenac (VOLTAREN) 50 MG EC tablet    Sig: Take 1 tablet (50 mg total) by mouth 2 (two) times daily.    Dispense:  20 tablet    Refill:  0    Order Specific Question:  Supervising  Provider    Answer:  Genella Rife  . amoxicillin (AMOXIL) 500 MG capsule    Sig: Take 1 capsule (500 mg total) by mouth 3 (three) times daily.    Dispense:  21 capsule    Refill:  0    Order Specific Question:  Supervising Provider    Answer:  Jerelyn Scott [[id number]]  . HYDROcodone-acetaminophen (NORCO/VICODIN) 5-325 MG tablet    Sig: Take 2 tablets by mouth every 4 (four) hours as needed.    Dispense:  10 tablet    Refill:  0    Order Specific Question:  Supervising Provider    Answer:  Genella Rife  . HYDROcodone-acetaminophen (NORCO/VICODIN) 5-325 MG per tablet 2 tablet    Sig:      I personally performed the services in this documentation, which was scribed in my presence.  The recorded information has been reviewed and considered.   Barnet Pall.   Lonia Skinner [place], PA-C 06/23/15 1354  Jerelyn Scott, MD 06/23/15 1357

## 2015-06-23 NOTE — Discharge Instructions (Signed)
Dental Pain  ° ° °Dental pain may be caused by many things, including:  °Tooth decay (cavities or caries). Cavities expose the nerve of your tooth to air and hot or cold temperatures. This can cause pain or discomfort.  °Abscess or infection. A dental abscess is a collection of infected pus from a bacterial infection in the inner part of the tooth (pulp). It usually occurs at the end of the tooth's root.  °Injury.  °An unknown reason (idiopathic). °Your pain may be mild or severe. It may only occur when:  °You are chewing.  °You are exposed to hot or cold temperature.  °You are eating or drinking sugary foods or beverages, such as soda or candy. °Your pain may also be constant.  °HOME CARE INSTRUCTIONS  °Watch your dental pain for any changes. The following actions may help to lessen any discomfort that you are feeling:  °Take medicines only as directed by your dentist.  °If you were prescribed an antibiotic medicine, finish all of it even if you start to feel better.  °Keep all follow-up visits as directed by your dentist. This is important.  °Do not apply heat to the outside of your face.  °Rinse your mouth or gargle with salt water if directed by your dentist. This helps with pain and swelling.  °You can make salt water by adding ¼ tsp of salt to 1 cup of warm water. °Apply ice to the painful area of your face:  °Put ice in a plastic bag.  °Place a towel between your skin and the bag.  °Leave the ice on for 20 minutes, 2-3 times per day. °Avoid foods or drinks that cause you pain, such as:  °Very hot or very cold foods or drinks.  °Sweet or sugary foods or drinks. °SEEK MEDICAL CARE IF:  °Your pain is not controlled with medicines.  °Your symptoms are worse.  °You have new symptoms. °SEEK IMMEDIATE MEDICAL CARE IF:  °You are unable to open your mouth.  °You are having trouble breathing or swallowing.  °You have a fever.  °Your face, neck, or jaw is swollen. °This information is not intended to replace advice  given to you by your health care provider. Make sure you discuss any questions you have with your health care provider.  °Document Released: 05/21/2005 Document Revised: 10/05/2014 Document Reviewed: 05/17/2014  °Elsevier Interactive Patient Education ©2016 [employer]vier Inc[employer]  ° ° °Emergency Department Resource Guide °1) Find a Doctor and Pay Out of Pocket °Although you won't have to find out who is covered by your insurance plan, it is a good idea to ask around and get recommendations. You will then need to call the office and see if the doctor you have chosen will accept you as a new patient and what types of options they offer for patients who are self-pay. Some doctors offer discounts or will set up payment plans for their patients who do not have insurance, but you will need to ask so you aren't surprised when you get to your appointment. ° °2) Contact Your Local Health Department °Not all health departments have doctors that can see patients for sick visits, but many do, so it is worth a call to see if yours does. If you don't know where your local health department is, you can check in your phone book. The CDC also has a tool to help you locate your state's health department, and many state websites also have listings of all of their local health departments. ° °  3) Find a Walk-in Clinic °If your illness is not likely to be very severe or complicated, you may want to try a walk in clinic. These are popping up all over the country in pharmacies, drugstores, and shopping centers. They're usually staffed by nurse practitioners or physician assistants that have been trained to treat common illnesses and complaints. They're usually fairly quick and inexpensive. However, if you have serious medical issues or chronic medical problems, these are probably not your best option. ° °No Primary Care Doctor: °- Call [facility] at  832-8000 - they can help you locate a primary care doctor that  accepts your insurance,  provides certain services, etc. °- Physician Referral Service- 1-800-533-3463 ° °Chronic Pain Problems: °Organization         Address  Phone   Notes  °[facility]  (336) 297-2271 Patients need to be referred by their primary care doctor.  ° °Medication Assistance: °Organization         Address  Phone   Notes  °[place] [facility] [address] °[place], [place] [address] (336) 641-8030 --Must be a resident of [place] °-- Must have NO insurance coverage whatsoever (no Medicaid/ Medicare, etc.) °-- The pt. MUST have a primary care doctor that directs their care regularly and follows them in the community °  °[facility]  (866) 331-1348   °[employer]  (888) 892-1162   ° °Agencies that provide inexpensive medical care: °Organization         Address  Phone   Notes  °[facility]  (336) 832-8035   °[facility]    (336) 832-7272   °[facility] [address] °[place], [place] [address] (336) 832-4777   °[facility] [address], °[place] (336) 271-4999   °[facility]    (336) 373-0678   °[facility]    (336) 272-1050   °[facility] ° [address], [place] Phone:  (336) 832-4444, Fax:  (336) 832-4440 Hours of Operation:  9 am - 6 pm, M-F.  Also accepts Medicaid/Medicare and self-pay.  °[facility] ° [address], [place] Phone: (336) 832-3150, Fax: (336) 832-3151. Hours of Operation:  8:30 am - 5:30 pm, M-F.  Also accepts Medicaid and self-pay.  °[facility] [place] [address], [place] Phone: (336) 878-6027   °[facility] [address], [place], [place] (336)723-1848, Ext. 123 Mondays & Thursdays: 7-9 AM.  First 15 patients are seen on a first come, first serve basis. °  ° °Medicaid-accepting [place] Providers: ° °Organization         Address  Phone    Notes  °[facility] [address], [place] (336) 641-2100 Also accepts self-pay patients.  °[facility] [address], [place] ° (336) 856-9996   °[facility] [address], [place] (336) 288-8857   °[facility] [address] [place] [address], [place] (336) 299-7000   °Veita Bland [address], [place]  ° (336) 373-1557 Only accepts [place] Access Medicaid patients after they have their name applied to their card.  ° °Self-Pay (no insurance) in [place]: ° °Organization         Address  Phone   Notes  °Sickle Cell Patients, [facility] [address], [place] (336) 832-1970   °[facility] [address], [place] (336) 832-4400   °[facility] ° [address], [place] (336) 992-4800   °[facility]/Dr. Osei-Bonsu ° [address] [place] [address], [place] or [address], [place] (336) 841-8500 Phone number for both [place] and [place] locations is the same.  °[facility] [address], [place] (336) 299-0000   °[facility] [address] [place] [address], [place] or [address] (336) 852-7530 °(336) 878-2260   °[facility] [address], [place] (336) 350-1642, phone; (336) 294-5005, fax Sees patients 1st and 3rd Saturday of every month.  Must not qualify for public or private insurance (i.e. Medicaid, Medicare, [place] Health Choice, Veterans' Benefits) • Household income should be no more than 200% of the poverty level •The clinic cannot treat you if you are pregnant or think you are pregnant • Sexually transmitted diseases are not treated at the clinic.  ° ° °Dental Care: °Organization         Address  Phone  Notes  °[facility] [address], [place] (336)  641-6152 Accepts children up to age 21 who are enrolled in Medicaid or [place] Health Choice; pregnant women with a Medicaid card; and children who have applied for Medicaid or [place] Health Choice, but were declined, whose parents can pay a reduced fee at time of service.  °[facility] [place]  [address], [place] (336) 641-7733 Accepts children up to age 21 who are enrolled in Medicaid or [place] Health Choice; pregnant women with a Medicaid card; and children who have applied for Medicaid or [place] Health Choice, but were declined, whose parents can pay a reduced fee at time of service.  °[facility] ° [address], [place] (336) 641-4533 Patients are seen by appointment only. Walk-ins are not accepted. [facility] will see patients 18 years of age and older. °Monday - Tuesday (8am-5pm) °Most Wednesdays (8:30-5pm) °$30 per visit, cash only  °[facility] ° [address], [place] (336) 641-4533 Patients are seen by appointment only. Walk-ins are not accepted. [facility] will see patients 18 years of age and older. °One Wednesday Evening (Monthly: Volunteer Based).  $30 per visit, cash only  °[facility]  (919) 537-3737 for adults; Children under age 4, call Graduate Pediatric Dentistry at ([phone]) [phone]. Children aged 4-14, please call ([phone]) [phone] to request a pediatric application. ° Dental services are provided in all areas of dental care including fillings, crowns and bridges, complete and partial dentures, implants, gum treatment, root canals, and extractions. Preventive care is also provided. Treatment is provided to both adults and children. °Patients are selected via a lottery and there is often a waiting list. °  °[facility] [address], °[place] ° (336) 763-8833 [email or web address] °  °[facility] [address], [place],  (336)723-1848, Ext.  123 Second and Fourth Thursday of each month, opens at 6:30 AM; Clinic ends at 9 AM.  Patients are seen on a first-come first-served basis, and a limited number are seen during each clinic.  ° °[facility] ° [address], [place],  (336) 723-7904   Eligibility Requirements °You must have lived in [place],   [place], or [place] counties for at least the last three months. °  You cannot be eligible for state or federal sponsored healthcare insurance, including Veterans Administration, Medicaid, or Medicare. °  You generally cannot be eligible for healthcare insurance through your employer.  °  How to apply: °Eligibility screenings are held every Tuesday and Wednesday afternoon from 1:00 pm until 4:00 pm. You do not need an appointment for the interview!  °[facility] [address], [place], [place] 336-631-2330   °[facility]  336-342-8273   °[facility]  336-703-3100   °[facility]  336-570-6415   ° °Behavioral Health Resources in the Community: °Intensive Outpatient Programs °Organization         Address  Phone  Notes  °[facility] [address], [place], [place] 336-878-6098   °[facility] [address], [place], [place] 336-832-9800   °[facility] [address], [place], [place] ° 336-882-2125   °[facility] [address],  °[place], [place] 1-800-853-5163 or 336-641-4981   °Substance Abuse Resources °Organization         Address  Phone  Notes  °[facility]  336-882-2125   °[facility]  336-784-9470   °[facility]  336-285-9073   °[facility]  336-845-3988   °Residential & Outpatient Substance Abuse Program  1-800-659-3381   °Psychological Services °Organization         Address  Phone  Notes  °[facility]  336- 832-9600   °[facility]  336- 378-7881   °[facility] [address], [place] 1-800-853-5163 or 336-641-4981   ° °Mobile Crisis Teams °Organization         Address  Phone  Notes  °[facility], Mobile Crisis Care Unit  1-877-626-1772   °Assertive °[facility] ° [address] [place], [place] 336-834-9664   °Sharon DeEsch [address] °[place] 336-554-5454   ° °Self-Help/Support Groups °Organization         Address  Phone             Notes  °[facility] - variety of support groups  336- 373-1402 Call for more information  °Narcotics Anonymous (NA), [facility] [address], °[place]  2 meetings at this location  ° °Residential Treatment Programs °Organization         Address  Phone  Notes  °[facility] [address],    °[place]  1-866-801-8205   °[facility] ° [address], [place], [place] 704-293-8524   °[facility] [address], [place] 336-845-3988 Admissions: 8am-3pm M-F  °[facility] [address]    °[place], [place] 336-841-1104   °[facility] [address], [place], [place] 336-379-7146   °[facility] [address]  °[place], [place] 336-285-9073   °[facility] - Intensive Outpatient [address], [place], [place] 336-852-3033   °[facility] [address]  °[place], [place] 1-877-615-2722 or 336-784-9470   °[facility] [address] [place], [place] 336-227-7417 Accepts Medicaid  °[facility] [address]  °[place] 1-800-659-3381 Substance Abuse/Addiction Treatment  ° °[place] Behavioral Health Resources °Organization         Address  Phone  Notes  °[facility]  (888) 581-9988   °Julie Brannon, PhD [address] [place], [place]   (336) 349-5553 or (336) 951-0000   °[facility]   [address] °[place], [place] (336) 349-4454   °[facility] [address], [place], [place] (336) 342-8316 Insurance/Medicaid/sponsorship through [facility]  °[facility] [address]                                    [place], [place] (336) 342-8316 Therapy/tele-psych/case  °[facility] [address]  ° [place], [place] (336) 349-2233    °Dr. Arfeen  (336) 349-4544   °[facility] [place]  [employer] [place] [facility] 1) [address], [place] °2) [address], [place] °3)  [address], [place] (336) 349-3220 °(336) 342-7768 ° °(336) 342-8140   °[place] [facility] (336) 342-1394 or (336) 342-3537 (After Hours)    ° ° ° °

## 2015-06-23 NOTE — ED Notes (Signed)
Pt complaining of jaw abscess/swelling x 4 days. States she has broken teeth/cavities on both sides of her lower jaw. Most pain from left lower side after using home remedy for tooth ache a few days prior. Mild swelling to lower jaw visible, pt states it hurts to talk and swallow, voice clear, denies any difficulty with breathing.

## 2015-07-28 ENCOUNTER — Emergency Department (HOSPITAL_BASED_OUTPATIENT_CLINIC_OR_DEPARTMENT_OTHER): Payer: Self-pay

## 2015-07-28 ENCOUNTER — Encounter (HOSPITAL_BASED_OUTPATIENT_CLINIC_OR_DEPARTMENT_OTHER): Payer: Self-pay | Admitting: *Deleted

## 2015-07-28 ENCOUNTER — Emergency Department (HOSPITAL_BASED_OUTPATIENT_CLINIC_OR_DEPARTMENT_OTHER)
Admission: EM | Admit: 2015-07-28 | Discharge: 2015-07-28 | Disposition: A | Discharge status: Home / Self Care | Payer: Self-pay | Attending: Emergency Medicine | Admitting: Emergency Medicine

## 2015-07-28 DIAGNOSIS — Z8659 Personal history of other mental and behavioral disorders: Secondary | ICD-10-CM | POA: Insufficient documentation

## 2015-07-28 DIAGNOSIS — Z8619 Personal history of other infectious and parasitic diseases: Secondary | ICD-10-CM | POA: Insufficient documentation

## 2015-07-28 DIAGNOSIS — Z3202 Encounter for pregnancy test, result negative: Secondary | ICD-10-CM | POA: Insufficient documentation

## 2015-07-28 DIAGNOSIS — N83202 Unspecified ovarian cyst, left side: Secondary | ICD-10-CM | POA: Insufficient documentation

## 2015-07-28 DIAGNOSIS — B9689 Other specified bacterial agents as the cause of diseases classified elsewhere: Secondary | ICD-10-CM

## 2015-07-28 DIAGNOSIS — N760 Acute vaginitis: Secondary | ICD-10-CM | POA: Insufficient documentation

## 2015-07-28 DIAGNOSIS — R102 Pelvic and perineal pain: Secondary | ICD-10-CM

## 2015-07-28 DIAGNOSIS — K5900 Constipation, unspecified: Secondary | ICD-10-CM

## 2015-07-28 DIAGNOSIS — N83299 Other ovarian cyst, unspecified side: Secondary | ICD-10-CM

## 2015-07-28 DIAGNOSIS — Z8719 Personal history of other diseases of the digestive system: Secondary | ICD-10-CM | POA: Insufficient documentation

## 2015-07-28 LAB — CBC WITH DIFFERENTIAL/PLATELET
BASOS PCT: 1 %
Basophils Absolute: 0 10*3/uL (ref 0.0–0.1)
EOS ABS: 0.1 10*3/uL (ref 0.0–0.7)
Eosinophils Relative: 2 %
HEMATOCRIT: 34.9 % — AB (ref 36.0–46.0)
Hemoglobin: 11.7 g/dL — ABNORMAL LOW (ref 12.0–15.0)
Lymphocytes Relative: 24 %
Lymphs Abs: 1.1 10*3/uL (ref 0.7–4.0)
MCH: 28.3 pg (ref 26.0–34.0)
MCHC: 33.5 g/dL (ref 30.0–36.0)
MCV: 84.5 fL (ref 78.0–100.0)
MONO ABS: 0.4 10*3/uL (ref 0.1–1.0)
MONOS PCT: 8 %
NEUTROS ABS: 2.9 10*3/uL (ref 1.7–7.7)
Neutrophils Relative %: 65 %
Platelets: 216 10*3/uL (ref 150–400)
RBC: 4.13 MIL/uL (ref 3.87–5.11)
RDW: 14.4 % (ref 11.5–15.5)
WBC: 4.4 10*3/uL (ref 4.0–10.5)

## 2015-07-28 LAB — URINE MICROSCOPIC-ADD ON

## 2015-07-28 LAB — COMPREHENSIVE METABOLIC PANEL
ALK PHOS: 37 U/L — AB (ref 38–126)
ALT: 8 U/L — ABNORMAL LOW (ref 14–54)
ANION GAP: 8 (ref 5–15)
AST: 20 U/L (ref 15–41)
Albumin: 4.2 g/dL (ref 3.5–5.0)
BUN: 9 mg/dL (ref 6–20)
CALCIUM: 9.4 mg/dL (ref 8.9–10.3)
CO2: 25 mmol/L (ref 22–32)
Chloride: 106 mmol/L (ref 101–111)
Creatinine, Ser: 0.71 mg/dL (ref 0.44–1.00)
GLUCOSE: 93 mg/dL (ref 65–99)
POTASSIUM: 3.8 mmol/L (ref 3.5–5.1)
Sodium: 139 mmol/L (ref 135–145)
Total Bilirubin: 0.4 mg/dL (ref 0.3–1.2)
Total Protein: 7.8 g/dL (ref 6.5–8.1)

## 2015-07-28 LAB — URINALYSIS, ROUTINE W REFLEX MICROSCOPIC
BILIRUBIN URINE: NEGATIVE
GLUCOSE, UA: NEGATIVE mg/dL
Hgb urine dipstick: NEGATIVE
KETONES UR: NEGATIVE mg/dL
Nitrite: NEGATIVE
PH: 5.5 (ref 5.0–8.0)
Protein, ur: NEGATIVE mg/dL
Specific Gravity, Urine: 1.019 (ref 1.005–1.030)

## 2015-07-28 LAB — PREGNANCY, URINE: Preg Test, Ur: NEGATIVE

## 2015-07-28 LAB — LIPASE, BLOOD: LIPASE: 28 U/L (ref 11–51)

## 2015-07-28 MED ORDER — SODIUM CHLORIDE 0.9 % IV BOLUS (SEPSIS)
1000.0000 mL | Freq: Once | INTRAVENOUS | Status: AC
  Administered 2015-07-28: 1000 mL via INTRAVENOUS

## 2015-07-28 MED ORDER — ONDANSETRON HCL 4 MG/2ML IJ SOLN
4.0000 mg | Freq: Once | INTRAMUSCULAR | Status: AC
  Administered 2015-07-28: 4 mg via INTRAVENOUS
  Filled 2015-07-28: qty 2

## 2015-07-28 MED ORDER — HYDROMORPHONE HCL 1 MG/ML IJ SOLN
1.0000 mg | Freq: Once | INTRAMUSCULAR | Status: AC
  Administered 2015-07-28: 1 mg via INTRAVENOUS
  Filled 2015-07-28: qty 1

## 2015-07-28 MED ORDER — METRONIDAZOLE 500 MG PO TABS: 500.0000 mg | ORAL_TABLET | Freq: Two times a day (BID) | ORAL | Status: DC

## 2015-07-28 MED ORDER — POLYETHYLENE GLYCOL 3350 17 G PO PACK: 17.0000 g | PACK | Freq: Every day | ORAL | Status: DC

## 2015-07-28 MED ORDER — MORPHINE SULFATE (PF) 4 MG/ML IV SOLN
4.0000 mg | Freq: Once | INTRAVENOUS | Status: AC
Start: 2015-07-28 — End: 2015-07-28
  Administered 2015-07-28: 4 mg via INTRAVENOUS
  Filled 2015-07-28: qty 1

## 2015-07-28 MED ORDER — HYDROCODONE-ACETAMINOPHEN 5-325 MG PO TABS: 1.0000 | ORAL_TABLET | Freq: Four times a day (QID) | ORAL | Status: DC | PRN

## 2015-07-28 MED FILL — HYDROCODON-APAP 5-325: 5-325 | 2 days supply | Qty: 10 | Fill #0

## 2015-07-28 MED FILL — metroNIDAZOLE 500 MG TABS: 500 | 7 days supply | Qty: 14 | Fill #0

## 2015-07-28 MED FILL — POLYETHYLENE GLYCOL 3350 PO: 14 days supply | Qty: 238 | Fill #0

## 2015-07-28 NOTE — ED Notes (Signed)
Patient transported to Ultrasound 

## 2015-07-28 NOTE — ED Notes (Signed)
C/o lower abd pain and low back pain that started this am. No vag discharge. C/o frequent urination. Last nl bm was yesterday. No injury

## 2015-07-28 NOTE — ED Notes (Signed)
Patient returns from radiology department via stretcher. 

## 2015-07-28 NOTE — ED Provider Notes (Signed)
CSN: [id number]     Arrival date & time 07/28/15  0754 History   First MD Initiated Contact with Patient 07/28/15 0802     No chief complaint on file.    (Consider location/radiation/quality/duration/timing/severity/associated sxs/prior Treatment) The history is provided by the patient.  Robin Lloyd is a 25 y.o. female hx of GERD, anxiety, ovarian cyst here with acute onset of lower abdominal pain.  Woke up this morning and had acute onset of diffuse lower abdominal pain. She has been urinating frequently but denies any dysuria or hematuria. She states that pain doesn't radiate. She has history of constipation but last BM was yesterday. Denies fever or chills or vomiting. Sexually active with one female partner but has no vaginal discharge. Has hx of ovarian cyst and LMP less than a month ago.    Past Medical History  Diagnosis Date  . GERD (gastroesophageal reflux disease)   . Anxiety   . Ovarian cyst   . Vaginal Pap smear, abnormal   . Chlamydia    Past Surgical History  Procedure Laterality Date  . No past surgeries     No family history on file. Social History  Substance Use Topics  . Smoking status: Never Smoker   . Smokeless tobacco: None  . Alcohol Use: No     Comment: occas.   OB History    Gravida Para Term Preterm AB TAB SAB Ectopic Multiple Living   1              Review of Systems  Gastrointestinal: Positive for abdominal pain.  All other systems reviewed and are negative.     Allergies  Review of patient's allergies indicates no known allergies.  Home Medications   Prior to Admission medications   Not on File   BP 103/72 mmHg  Pulse 81  Temp(Src) 98.3 F (36.8 C) (Oral)  Resp 18  Ht  (1.549 m)  Wt 132 lb (59.875 kg)  BMI 24.95 kg/m2  SpO2 100%  LMP 07/09/2015 Physical Exam  Constitutional: She is oriented to person, place, and time.  Uncomfortable   HENT:  Head: Normocephalic.  Mouth/Throat: Oropharynx is clear and moist.  Eyes:  Conjunctivae are normal. Pupils are equal, round, and reactive to light.  Neck: Normal range of motion. Neck supple.  Cardiovascular: Normal rate, regular rhythm and normal heart sounds.   Pulmonary/Chest: Effort normal and breath sounds normal. No respiratory distress. She has no wheezes. She has no rales.  Abdominal: Soft. Bowel sounds are normal.  Diffuse lower abdominal tenderness, no CVAT   Genitourinary:  No vaginal discharge. Mild diffuse pelvic tenderness  Musculoskeletal: Normal range of motion. She exhibits no edema or tenderness.  Neurological: She is alert and oriented to person, place, and time. No cranial nerve deficit. Coordination normal.  Skin: Skin is warm and dry.  Psychiatric: She has a normal mood and affect. Her behavior is normal. Judgment and thought content normal.  Nursing note and vitals reviewed.   ED Course  Procedures (including critical care time) Labs Review Labs Reviewed  WET PREP, GENITAL - Abnormal; Notable for the following:    Clue Cells Wet Prep HPF POC PRESENT (*)    WBC, Wet Prep HPF POC MANY (*)    All other components within normal limits  URINALYSIS, ROUTINE W REFLEX MICROSCOPIC (NOT AT [facility]) - Abnormal; Notable for the following:    Leukocytes, UA SMALL (*)    All other components within normal limits  CBC WITH DIFFERENTIAL/PLATELET -  Abnormal; Notable for the following:    Hemoglobin 11.7 (*)    HCT 34.9 (*)    All other components within normal limits  COMPREHENSIVE METABOLIC PANEL - Abnormal; Notable for the following:    ALT 8 (*)    Alkaline Phosphatase 37 (*)    All other components within normal limits  URINE MICROSCOPIC-ADD ON - Abnormal; Notable for the following:    Squamous Epithelial / LPF 0-5 (*)    Bacteria, UA MANY (*)    All other components within normal limits  PREGNANCY, URINE  LIPASE, BLOOD  GC/CHLAMYDIA PROBE AMP ([place]) NOT AT [facility]    Imaging Review US Transvaginal Non-ob  07/28/2015  CLINICAL DATA:   Pelvic pain EXAM: TRANSABDOMINAL AND TRANSVAGINAL ULTRASOUND OF PELVIS DOPPLER ULTRASOUND OF OVARIES TECHNIQUE: Both transabdominal and transvaginal ultrasound examinations of the pelvis were performed. Transabdominal technique was performed for global imaging of the pelvis including uterus, ovaries, adnexal regions, and pelvic cul-de-sac. It was necessary to proceed with endovaginal exam following the transabdominal exam to visualize the endometrium and ovaries. Color and duplex Doppler ultrasound was utilized to evaluate blood flow to the ovaries. COMPARISON:  None. FINDINGS: Uterus Measurements: 8.4 x 3.8 x 5.1 cm. No fibroids or other mass visualized. Endometrium Thickness: 11 mm. Uniform. Tiny amount of fluid in the lower uterine segment and at the region of the cervix. This is nonspecific. Right ovary Measurements: 3.7 x 1.7 x 2.3 cm. Normal appearance/no adnexal mass. Left ovary Measurements: 4.6 x 3.6 x 4.0 cm. The left ovary contains a complex cystic lesion 3.0 x 2.6 x 2.7 cm. There is internal echogenic signal which is predominantly homogeneous. Minimal internal vascularity. Pulsed Doppler evaluation of both ovaries demonstrates normal low-resistance arterial and venous waveforms. Other findings Small amount of free fluid is likely physiologic. IMPRESSION: Complex cystic lesion in the left ovary measures 3.0 cm. Follow-up in 6-12 weeks is recommended to ensure resolution. Normal arterial and venous Doppler waveforms in both ovaries. No evidence of ovarian torsion. Electronically Signed   By: Jolaine Click M.D.   On: 07/28/2015 10:51   US Pelvis Complete  07/28/2015  CLINICAL DATA:  Pelvic pain EXAM: TRANSABDOMINAL AND TRANSVAGINAL ULTRASOUND OF PELVIS DOPPLER ULTRASOUND OF OVARIES TECHNIQUE: Both transabdominal and transvaginal ultrasound examinations of the pelvis were performed. Transabdominal technique was performed for global imaging of the pelvis including uterus, ovaries, adnexal regions, and  pelvic cul-de-sac. It was necessary to proceed with endovaginal exam following the transabdominal exam to visualize the endometrium and ovaries. Color and duplex Doppler ultrasound was utilized to evaluate blood flow to the ovaries. COMPARISON:  None. FINDINGS: Uterus Measurements: 8.4 x 3.8 x 5.1 cm. No fibroids or other mass visualized. Endometrium Thickness: 11 mm. Uniform. Tiny amount of fluid in the lower uterine segment and at the region of the cervix. This is nonspecific. Right ovary Measurements: 3.7 x 1.7 x 2.3 cm. Normal appearance/no adnexal mass. Left ovary Measurements: 4.6 x 3.6 x 4.0 cm. The left ovary contains a complex cystic lesion 3.0 x 2.6 x 2.7 cm. There is internal echogenic signal which is predominantly homogeneous. Minimal internal vascularity. Pulsed Doppler evaluation of both ovaries demonstrates normal low-resistance arterial and venous waveforms. Other findings Small amount of free fluid is likely physiologic. IMPRESSION: Complex cystic lesion in the left ovary measures 3.0 cm. Follow-up in 6-12 weeks is recommended to ensure resolution. Normal arterial and venous Doppler waveforms in both ovaries. No evidence of ovarian torsion. Electronically Signed   By: Merton Border  Hoss M.D.   On: 07/28/2015 10:51   US Renal  07/28/2015  CLINICAL DATA:  Flank and pelvic pain, inability to urinate earlier, history of ovarian cyst. EXAM: RENAL / URINARY TRACT ULTRASOUND COMPLETE COMPARISON:  None in PACs FINDINGS: Right Kidney: Length: 10.1 cm. Echogenicity within normal limits. No mass or hydronephrosis visualized. Left Kidney: Length: 10 cm. Echogenicity within normal limits. No mass or hydronephrosis visualized. Bladder: The partially distended urinary bladder is normal. Bilateral ureteral jets are observed. The patient was able to void twice during the study. IMPRESSION: Normal renal ultrasound examination. Electronically Signed   By: Angelli Baruch  [place] M.D.   On: 07/28/2015 10:49   [place] Art/ven Flow Abd  Pelv Doppler  07/28/2015  CLINICAL DATA:  Pelvic pain EXAM: TRANSABDOMINAL AND TRANSVAGINAL ULTRASOUND OF PELVIS DOPPLER ULTRASOUND OF OVARIES TECHNIQUE: Both transabdominal and transvaginal ultrasound examinations of the pelvis were performed. Transabdominal technique was performed for global imaging of the pelvis including uterus, ovaries, adnexal regions, and pelvic cul-de-sac. It was necessary to proceed with endovaginal exam following the transabdominal exam to visualize the endometrium and ovaries. Color and duplex Doppler ultrasound was utilized to evaluate blood flow to the ovaries. COMPARISON:  None. FINDINGS: Uterus Measurements: 8.4 x 3.8 x 5.1 cm. No fibroids or other mass visualized. Endometrium Thickness: 11 mm. Uniform. Tiny amount of fluid in the lower uterine segment and at the region of the cervix. This is nonspecific. Right ovary Measurements: 3.7 x 1.7 x 2.3 cm. Normal appearance/no adnexal mass. Left ovary Measurements: 4.6 x 3.6 x 4.0 cm. The left ovary contains a complex cystic lesion 3.0 x 2.6 x 2.7 cm. There is internal echogenic signal which is predominantly homogeneous. Minimal internal vascularity. Pulsed Doppler evaluation of both ovaries demonstrates normal low-resistance arterial and venous waveforms. Other findings Small amount of free fluid is likely physiologic. IMPRESSION: Complex cystic lesion in the left ovary measures 3.0 cm. Follow-up in 6-12 weeks is recommended to ensure resolution. Normal arterial and venous Doppler waveforms in both ovaries. No evidence of ovarian torsion. Electronically Signed   By: Jolaine Click M.D.   On: 07/28/2015 10:51   Dg Abd Acute W/chest  07/28/2015  CLINICAL DATA:  Lower abdominal pain, low back pain starting this morning, frequent urination EXAM: DG ABDOMEN ACUTE W/ 1V CHEST COMPARISON:  06/08/2015 FINDINGS: Cardiomediastinal silhouette is unremarkable. No acute infiltrate or pleural effusion. No pulmonary edema. There is normal small bowel  gas pattern. Abundant stool noted in right colon and proximal transverse colon. Moderate gas noted in distal transverse colon and splenic flexure of the colon. There is no free abdominal air. Some colonic stool and gas noted in distal sigmoid colon. IMPRESSION: No acute disease within chest. Abundant stool noted in right colon and proximal transverse colon. Moderate gas noted in distal transverse colon and splenic flexure of the colon. No evidence of free abdominal air. Electronically Signed   By: Natasha Mead M.D.   On: 07/28/2015 11:21   I have personally reviewed and evaluated these images and lab results as part of my medical decision-making.   EKG Interpretation None      MDM   Final diagnoses:  Pelvic pain in female   Jahnyla Parrillo is a 25 y.o. female here with acute onset lower abdominal pain. Consider cyst vs torsion vs renal colic, less likely constipation or appy. Will get labs, UA, xrays, pelvic exam and likely [place].   11:33 AM US showed complex cyst L ovary ? Small fluid in the pelvis. Pain  controlled. Xray showed constipation. Labs unremarkable. Some BV on wet prep. Repeat abdominal exam showed minimal LLQ tenderness now and no rebound. Felt better. I think pain likely from ruptured ovarian cyst. No torsion on [place] and I doubt torsion detorsion event as pain is controlled and nl WBC. Recommend f/u in a month for repeat US of the cyst.    Richardean Canal, MD 07/28/15 1135

## 2015-07-28 NOTE — Discharge Instructions (Signed)
Take flagyl twice daily for a week for bacterial vaginosis.   Take miralax daily for constipation until you have soft bowel movements.   Take motrin for pain.  Take vicodin for severe pain.  See GYN doctor. You need repeat US in about 4-6 weeks to make sure the cyst has gone away.   Return to ER if you have severe pain, vomiting, fevers.

## 2015-07-29 LAB — GC/CHLAMYDIA PROBE AMP (~~LOC~~) NOT AT ARMC
CHLAMYDIA, DNA PROBE: NEGATIVE
NEISSERIA GONORRHEA: NEGATIVE

## 2015-08-02 LAB — WET PREP, GENITAL
Sperm: NONE SEEN
TRICH WET PREP: NONE SEEN
Yeast Wet Prep HPF POC: NONE SEEN

## 2015-08-09 ENCOUNTER — Ambulatory Visit: Payer: Medicaid Other | Admitting: Obstetrics & Gynecology

## 2015-08-29 ENCOUNTER — Emergency Department (HOSPITAL_BASED_OUTPATIENT_CLINIC_OR_DEPARTMENT_OTHER)
Admission: EM | Admit: 2015-08-29 | Discharge: 2015-08-29 | Disposition: A | Discharge status: Home / Self Care | Payer: Self-pay | Attending: Emergency Medicine | Admitting: Emergency Medicine

## 2015-08-29 ENCOUNTER — Encounter (HOSPITAL_BASED_OUTPATIENT_CLINIC_OR_DEPARTMENT_OTHER): Payer: Self-pay | Admitting: *Deleted

## 2015-08-29 ENCOUNTER — Emergency Department (HOSPITAL_BASED_OUTPATIENT_CLINIC_OR_DEPARTMENT_OTHER): Payer: Self-pay

## 2015-08-29 DIAGNOSIS — B9689 Other specified bacterial agents as the cause of diseases classified elsewhere: Secondary | ICD-10-CM

## 2015-08-29 DIAGNOSIS — Z8619 Personal history of other infectious and parasitic diseases: Secondary | ICD-10-CM | POA: Insufficient documentation

## 2015-08-29 DIAGNOSIS — Z79899 Other long term (current) drug therapy: Secondary | ICD-10-CM | POA: Insufficient documentation

## 2015-08-29 DIAGNOSIS — Z3202 Encounter for pregnancy test, result negative: Secondary | ICD-10-CM | POA: Insufficient documentation

## 2015-08-29 DIAGNOSIS — K5900 Constipation, unspecified: Secondary | ICD-10-CM | POA: Insufficient documentation

## 2015-08-29 DIAGNOSIS — Z8659 Personal history of other mental and behavioral disorders: Secondary | ICD-10-CM | POA: Insufficient documentation

## 2015-08-29 DIAGNOSIS — R112 Nausea with vomiting, unspecified: Secondary | ICD-10-CM | POA: Insufficient documentation

## 2015-08-29 DIAGNOSIS — N760 Acute vaginitis: Secondary | ICD-10-CM | POA: Insufficient documentation

## 2015-08-29 DIAGNOSIS — R1032 Left lower quadrant pain: Secondary | ICD-10-CM

## 2015-08-29 LAB — WET PREP, GENITAL
Sperm: NONE SEEN
Trich, Wet Prep: NONE SEEN
YEAST WET PREP: NONE SEEN

## 2015-08-29 LAB — URINE MICROSCOPIC-ADD ON

## 2015-08-29 LAB — URINALYSIS, ROUTINE W REFLEX MICROSCOPIC
BILIRUBIN URINE: NEGATIVE
Glucose, UA: NEGATIVE mg/dL
Hgb urine dipstick: NEGATIVE
KETONES UR: NEGATIVE mg/dL
NITRITE: NEGATIVE
PH: 6 (ref 5.0–8.0)
PROTEIN: NEGATIVE mg/dL
Specific Gravity, Urine: 1.007 (ref 1.005–1.030)

## 2015-08-29 LAB — PREGNANCY, URINE: Preg Test, Ur: NEGATIVE

## 2015-08-29 MED ORDER — ONDANSETRON 4 MG PO TBDP
4.0000 mg | ORAL_TABLET | Freq: Once | ORAL | Status: AC
  Administered 2015-08-29: 4 mg via ORAL
  Filled 2015-08-29: qty 1

## 2015-08-29 MED ORDER — HYDROCODONE-ACETAMINOPHEN 5-325 MG PO TABS: 1.0000 | ORAL_TABLET | Freq: Four times a day (QID) | ORAL | Status: DC | PRN

## 2015-08-29 MED ORDER — KETOROLAC TROMETHAMINE 30 MG/ML IJ SOLN
30.0000 mg | Freq: Once | INTRAMUSCULAR | Status: AC
  Administered 2015-08-29: 30 mg via INTRAMUSCULAR
  Filled 2015-08-29: qty 1

## 2015-08-29 MED ORDER — METRONIDAZOLE 500 MG PO TABS: 500.0000 mg | ORAL_TABLET | Freq: Two times a day (BID) | ORAL | Status: DC

## 2015-08-29 MED ORDER — HYDROCODONE-ACETAMINOPHEN 5-325 MG PO TABS
1.0000 | ORAL_TABLET | Freq: Once | ORAL | Status: DC
  Filled 2015-08-29: qty 1

## 2015-08-29 NOTE — ED Notes (Signed)
Pt placed on auto vitals Q30.  

## 2015-08-29 NOTE — Discharge Instructions (Signed)
1. Medications: Take Flagyl until completion, Miralax for constipation (3 capful's mixed in water today, one cap mixed with water daily until bowel movements are regular), pain medication only as needed - This can make you very drowsy - please do not drink or drive on this medication, continue usual home medications 2. Treatment: rest, drink plenty of fluids 3. Follow Up: Please follow up with your primary doctor or OBGYN in 3 days for discussion of your diagnoses and further evaluation after today's visit; Please return to the ER for new or worsening symptoms, any additional concerns.

## 2015-08-29 NOTE — ED Notes (Signed)
Vaginal discharge. Nausea and vomiting. States she has noticed streaks of blood in her vomitus.

## 2015-08-29 NOTE — ED Provider Notes (Signed)
CSN: [id number]     Arrival date & time 08/29/15  1117 History   First MD Initiated Contact with Patient 08/29/15 1317     Chief Complaint  Patient presents with  . Vaginal Discharge     (Consider location/radiation/quality/duration/timing/severity/associated sxs/prior Treatment) Patient is a 25 y.o. female presenting with vaginal discharge. The history is provided by the patient and medical records. No language interpreter was used.  Vaginal Discharge Associated symptoms: abdominal pain, nausea and vomiting   Associated symptoms: no dysuria and no fever    Robin Lloyd is a 25 y.o. female  who presents to the Emergency Department complaining of worsening LLQ abdominal pain which radiates down toward groin and across toward RLQ x 2-3 weeks. Patient was seen in ED on 2/23 - ultrasound was done showing complex 3 cm ovarian cyst. Wet prep with BV. Patient states compliance with flagyl. Pain medication worked initially, but she has now ran out of pain meds and abdominal pain worsening. Admits to associated nausea, vomiting. This morning < 1 tsp sized blood in emesis. + clear vaginal discharge. Denies fever.    Past Medical History  Diagnosis Date  . GERD (gastroesophageal reflux disease)   . Anxiety   . Ovarian cyst   . Vaginal Pap smear, abnormal   . Chlamydia    Past Surgical History  Procedure Laterality Date  . No past surgeries     No family history on file. Social History  Substance Use Topics  . Smoking status: Never Smoker   . Smokeless tobacco: None  . Alcohol Use: No     Comment: occas.   OB History    Gravida Para Term Preterm AB TAB SAB Ectopic Multiple Living   1              Review of Systems  Constitutional: Negative for fever and chills.  HENT: Negative for congestion.   Eyes: Negative for visual disturbance.  Respiratory: Negative for cough and shortness of breath.   Cardiovascular: Negative.   Gastrointestinal: Positive for nausea, vomiting,  abdominal pain and constipation. Negative for diarrhea and blood in stool.  Genitourinary: Positive for vaginal discharge. Negative for dysuria and vaginal bleeding.  Musculoskeletal: Negative for back pain.  Skin: Negative for rash.  Neurological: Negative for dizziness and headaches.      Allergies  Review of patient's allergies indicates no known allergies.  Home Medications   Prior to Admission medications   Medication Sig Start Date End Date Taking? Authorizing Provider  HYDROcodone-acetaminophen (NORCO/VICODIN) 5-325 MG tablet Take 1 tablet by mouth every 6 (six) hours as needed for severe pain. 08/29/15   Chase Picket Ward, PA-C  metroNIDAZOLE (FLAGYL) 500 MG tablet Take 1 tablet (500 mg total) by mouth 2 (two) times daily. 08/29/15   Chase Picket Ward, PA-C  polyethylene glycol (MIRALAX / GLYCOLAX) packet Take 17 g by mouth daily. 07/28/15   Richardean Canal, MD   BP 113/78 mmHg  Pulse 86  Temp(Src) 98.4 F (36.9 C) (Oral)  Resp 17  Ht  (1.575 m)  Wt 59.875 kg  BMI 24.14 kg/m2  SpO2 100%  LMP 08/06/2015 Physical Exam  Constitutional: She is oriented to person, place, and time. She appears well-developed and well-nourished.  Alert and in no acute distress  HENT:  Head: Normocephalic and atraumatic.  Cardiovascular: Normal rate, regular rhythm and normal heart sounds.  Exam reveals no gallop and no friction rub.   No murmur heard. Pulmonary/Chest: Effort normal and breath sounds normal.  No respiratory distress. She has no wheezes. She has no rales. She exhibits no tenderness.  Abdominal: Soft. Bowel sounds are normal. She exhibits no distension and no mass. There is tenderness (LLQ). There is no rebound and no guarding.  Genitourinary:  Chaperone present for exam. No rashes, lesions, or tenderness to external genitalia. No erythema, injury, or tenderness to vaginal mucosa. + white vaginal discharge within vaginal vault. No  adnexal masses, tenderness, or fullness. +  white discharge from cervical os.  Neurological: She is alert and oriented to person, place, and time.  Skin: Skin is warm and dry.  Nursing note and vitals reviewed.   ED Course  Procedures (including critical care time) Labs Review Labs Reviewed  WET PREP, GENITAL - Abnormal; Notable for the following:    Clue Cells Wet Prep HPF POC PRESENT (*)    WBC, Wet Prep HPF POC MANY (*)    All other components within normal limits  URINALYSIS, ROUTINE W REFLEX MICROSCOPIC (NOT AT [facility]) - Abnormal; Notable for the following:    APPearance CLOUDY (*)    Leukocytes, UA LARGE (*)    All other components within normal limits  URINE MICROSCOPIC-ADD ON - Abnormal; Notable for the following:    Squamous Epithelial / LPF 0-5 (*)    Bacteria, UA FEW (*)    All other components within normal limits  PREGNANCY, URINE  GC/CHLAMYDIA PROBE AMP ([place]) NOT AT [facility]    Imaging Review [place] Transvaginal Non-ob  08/29/2015  CLINICAL DATA:  Left lower quadrant pain for 1 month. Nausea vomiting for 3 days. EXAM: TRANSABDOMINAL AND TRANSVAGINAL ULTRASOUND OF PELVIS DOPPLER ULTRASOUND OF OVARIES TECHNIQUE: Both transabdominal and transvaginal ultrasound examinations of the pelvis were performed. Transabdominal technique was performed for global imaging of the pelvis including uterus, ovaries, adnexal regions, and pelvic cul-de-sac. It was necessary to proceed with endovaginal exam following the transabdominal exam to visualize the uterus, endometrium, ovaries and adnexa . Color and duplex Doppler ultrasound was utilized to evaluate blood flow to the ovaries. COMPARISON:  Pelvic ultrasound 07/28/2015 FINDINGS: Uterus Measurements: 8.1 x 4.2 x 5.4 cm. No fibroids or other mass visualized. Endometrium Thickness: 12 mm in thickness with minimal fluid within the canal. No focal abnormality visualized. Right ovary Measurements: 4.4 x 1.7 x 3.1 cm. Normal appearance/no adnexal mass. Left ovary Measurements: 3.3 x 3.0 x 2.9  cm. Previously seen 3 cm cystic lesion within the left ovary has resolved. Normal appearance/no adnexal mass. Pulsed Doppler evaluation of both ovaries demonstrates normal low-resistance arterial and venous waveforms. Other findings Small amount of free fluid in the cul-de-sac and left adnexa IMPRESSION: No acute findings. Resolution of the previously seen left ovarian cyst. Small amount of free fluid in the pelvis. Electronically Signed   By: Charlett NoseKevin  Dover M.D.   On: 08/29/2015 15:22   [place] Pelvis Complete  08/29/2015  CLINICAL DATA:  Left lower quadrant pain for 1 month. Nausea vomiting for 3 days. EXAM: TRANSABDOMINAL AND TRANSVAGINAL ULTRASOUND OF PELVIS DOPPLER ULTRASOUND OF OVARIES TECHNIQUE: Both transabdominal and transvaginal ultrasound examinations of the pelvis were performed. Transabdominal technique was performed for global imaging of the pelvis including uterus, ovaries, adnexal regions, and pelvic cul-de-sac. It was necessary to proceed with endovaginal exam following the transabdominal exam to visualize the uterus, endometrium, ovaries and adnexa . Color and duplex Doppler ultrasound was utilized to evaluate blood flow to the ovaries. COMPARISON:  Pelvic ultrasound 07/28/2015 FINDINGS: Uterus Measurements: 8.1 x 4.2 x 5.4 cm. No fibroids or  other mass visualized. Endometrium Thickness: 12 mm in thickness with minimal fluid within the canal. No focal abnormality visualized. Right ovary Measurements: 4.4 x 1.7 x 3.1 cm. Normal appearance/no adnexal mass. Left ovary Measurements: 3.3 x 3.0 x 2.9 cm. Previously seen 3 cm cystic lesion within the left ovary has resolved. Normal appearance/no adnexal mass. Pulsed Doppler evaluation of both ovaries demonstrates normal low-resistance arterial and venous waveforms. Other findings Small amount of free fluid in the cul-de-sac and left adnexa IMPRESSION: No acute findings. Resolution of the previously seen left ovarian cyst. Small amount of free fluid in the  pelvis. Electronically Signed   By: Charlett Nose M.D.   On: 08/29/2015 15:22   [place] Art/ven Flow Abd Pelv Doppler  08/29/2015  CLINICAL DATA:  Left lower quadrant pain for 1 month. Nausea vomiting for 3 days. EXAM: TRANSABDOMINAL AND TRANSVAGINAL ULTRASOUND OF PELVIS DOPPLER ULTRASOUND OF OVARIES TECHNIQUE: Both transabdominal and transvaginal ultrasound examinations of the pelvis were performed. Transabdominal technique was performed for global imaging of the pelvis including uterus, ovaries, adnexal regions, and pelvic cul-de-sac. It was necessary to proceed with endovaginal exam following the transabdominal exam to visualize the uterus, endometrium, ovaries and adnexa . Color and duplex Doppler ultrasound was utilized to evaluate blood flow to the ovaries. COMPARISON:  Pelvic ultrasound 07/28/2015 FINDINGS: Uterus Measurements: 8.1 x 4.2 x 5.4 cm. No fibroids or other mass visualized. Endometrium Thickness: 12 mm in thickness with minimal fluid within the canal. No focal abnormality visualized. Right ovary Measurements: 4.4 x 1.7 x 3.1 cm. Normal appearance/no adnexal mass. Left ovary Measurements: 3.3 x 3.0 x 2.9 cm. Previously seen 3 cm cystic lesion within the left ovary has resolved. Normal appearance/no adnexal mass. Pulsed Doppler evaluation of both ovaries demonstrates normal low-resistance arterial and venous waveforms. Other findings Small amount of free fluid in the cul-de-sac and left adnexa IMPRESSION: No acute findings. Resolution of the previously seen left ovarian cyst. Small amount of free fluid in the pelvis. Electronically Signed   By: Charlett Nose M.D.   On: 08/29/2015 15:22   I have personally reviewed and evaluated these images and lab results as part of my medical decision-making.   EKG Interpretation None      MDM   Final diagnoses:  LLQ abdominal pain  LLQ pain  Bacterial vaginosis   Robin Lloyd presents for LLQ abdominal pain and vaginal discharge. She was seen for  same on 2/23 where ultrasound was performed showing 3cm left ovarian cyst and wet prep + for BV (treated with flagyl). Repeat ultrasound today shows no acute findings and resolution of previously seen ovarian cyst-small amount of free fluid in the pelvis. Wet prep today is again positive for clue cells and WBCs. Upreg negative and UA reviewed. Will treat with Flagyl. G&C sent. Patient was encouraged to find GYN physician in the area and to follow-up with him in regards to today's visit. Patient also complaining of constipation. Instructions for MiraLAX flush given. Return precautions and home care instructions discussed. All questions answered.  [facility] Ward, PA-C 08/29/15 1710  Marily Memos, MD 08/31/15 1049

## 2015-08-30 LAB — GC/CHLAMYDIA PROBE AMP (~~LOC~~) NOT AT ARMC
CHLAMYDIA, DNA PROBE: NEGATIVE
NEISSERIA GONORRHEA: NEGATIVE

## 2015-09-12 MED FILL — HYDROCODON-APAP 5-325: 5-325 | 2 days supply | Qty: 5 | Fill #0

## 2015-09-12 MED FILL — metroNIDAZOLE 500 MG TABS: 500 | 7 days supply | Qty: 14 | Fill #0

## 2015-12-12 ENCOUNTER — Emergency Department (HOSPITAL_BASED_OUTPATIENT_CLINIC_OR_DEPARTMENT_OTHER)
Admission: EM | Admit: 2015-12-12 | Discharge: 2015-12-12 | Disposition: A | Discharge status: Home / Self Care | Payer: No Typology Code available for payment source | Attending: Emergency Medicine | Admitting: Emergency Medicine

## 2015-12-12 ENCOUNTER — Encounter (HOSPITAL_BASED_OUTPATIENT_CLINIC_OR_DEPARTMENT_OTHER): Payer: Self-pay

## 2015-12-12 DIAGNOSIS — N730 Acute parametritis and pelvic cellulitis: Secondary | ICD-10-CM | POA: Insufficient documentation

## 2015-12-12 DIAGNOSIS — N390 Urinary tract infection, site not specified: Secondary | ICD-10-CM

## 2015-12-12 DIAGNOSIS — N760 Acute vaginitis: Secondary | ICD-10-CM | POA: Insufficient documentation

## 2015-12-12 DIAGNOSIS — B9689 Other specified bacterial agents as the cause of diseases classified elsewhere: Secondary | ICD-10-CM

## 2015-12-12 LAB — URINALYSIS, ROUTINE W REFLEX MICROSCOPIC
Bilirubin Urine: NEGATIVE
GLUCOSE, UA: NEGATIVE mg/dL
Ketones, ur: NEGATIVE mg/dL
Nitrite: NEGATIVE
Protein, ur: 100 mg/dL — AB
SPECIFIC GRAVITY, URINE: 1.014 (ref 1.005–1.030)
pH: 7.5 (ref 5.0–8.0)

## 2015-12-12 LAB — WET PREP, GENITAL
Sperm: NONE SEEN
Trich, Wet Prep: NONE SEEN
Yeast Wet Prep HPF POC: NONE SEEN

## 2015-12-12 LAB — URINE MICROSCOPIC-ADD ON

## 2015-12-12 LAB — PREGNANCY, URINE: Preg Test, Ur: NEGATIVE

## 2015-12-12 MED ORDER — DOXYCYCLINE HYCLATE 100 MG PO CAPS: 100.0000 mg | ORAL_CAPSULE | Freq: Two times a day (BID) | ORAL | Status: DC

## 2015-12-12 MED ORDER — DOXYCYCLINE HYCLATE 100 MG PO TABS
100.0000 mg | ORAL_TABLET | Freq: Once | ORAL | Status: AC
  Administered 2015-12-12: 100 mg via ORAL
  Filled 2015-12-12: qty 1

## 2015-12-12 MED ORDER — METRONIDAZOLE 500 MG PO TABS: 500.0000 mg | ORAL_TABLET | Freq: Two times a day (BID) | ORAL | Status: DC

## 2015-12-12 MED ORDER — ACETAMINOPHEN 500 MG PO TABS
1000.0000 mg | ORAL_TABLET | Freq: Once | ORAL | Status: AC
  Administered 2015-12-12: 1000 mg via ORAL
  Filled 2015-12-12: qty 2

## 2015-12-12 MED ORDER — FOSFOMYCIN TROMETHAMINE 3 G PO PACK
3.0000 g | PACK | Freq: Once | ORAL | Status: AC
  Administered 2015-12-12: 3 g via ORAL
  Filled 2015-12-12: qty 3

## 2015-12-12 MED ORDER — IBUPROFEN 800 MG PO TABS
800.0000 mg | ORAL_TABLET | Freq: Once | ORAL | Status: DC
  Filled 2015-12-12: qty 1

## 2015-12-12 MED ORDER — LIDOCAINE HCL (PF) 1 % IJ SOLN
INTRAMUSCULAR | Status: AC
  Administered 2015-12-12: 0.9 mL
  Filled 2015-12-12: qty 5

## 2015-12-12 MED ORDER — CEFTRIAXONE SODIUM 250 MG IJ SOLR
250.0000 mg | Freq: Once | INTRAMUSCULAR | Status: AC
  Administered 2015-12-12: 250 mg via INTRAMUSCULAR
  Filled 2015-12-12: qty 250

## 2015-12-12 MED FILL — metroNIDAZOLE 500 MG TABS: 500 | 7 days supply | Qty: 14 | Fill #0

## 2015-12-12 MED FILL — DOXYCYCLINE HYC 100 MG CAP: 100 | 7 days supply | Qty: 14 | Fill #0

## 2015-12-12 NOTE — ED Notes (Signed)
Lower abd pain, vaginal bleeding x today-states too soon for next period-LMP 6/30

## 2015-12-12 NOTE — Discharge Instructions (Signed)
Take 4 over the counter ibuprofen tablets 3 times a day or 2 over-the-counter naproxen tablets twice a day for pain. °Also take tylenol 1000mg(2 extra strength) four times a day.  ° ° ° °Bacterial Vaginosis °Bacterial vaginosis is a vaginal infection that occurs when the normal balance of bacteria in the vagina is disrupted. It results from an overgrowth of certain bacteria. This is the most common vaginal infection in women of childbearing age. Treatment is important to prevent complications, especially in pregnant women, as it can cause a premature delivery. °CAUSES  °Bacterial vaginosis is caused by an increase in harmful bacteria that are normally present in smaller amounts in the vagina. Several different kinds of bacteria can cause bacterial vaginosis. However, the reason that the condition develops is not fully understood. °RISK FACTORS °Certain activities or behaviors can put you at an increased risk of developing bacterial vaginosis, including: °· Having a new sex partner or multiple sex partners. °· Douching. °· Using an intrauterine device (IUD) for contraception. °Women do not get bacterial vaginosis from toilet seats, bedding, swimming pools, or contact with objects around them. °SIGNS AND SYMPTOMS  °Some women with bacterial vaginosis have no signs or symptoms. Common symptoms include: °· Grey vaginal discharge. °· A fishlike odor with discharge, especially after sexual intercourse. °· Itching or burning of the vagina and vulva. °· Burning or pain with urination. °DIAGNOSIS  °Your health care provider will take a medical history and examine the vagina for signs of bacterial vaginosis. A sample of vaginal fluid may be taken. Your health care provider will look at this sample under a microscope to check for bacteria and abnormal cells. A vaginal pH test may also be done.  °TREATMENT  °Bacterial vaginosis may be treated with antibiotic medicines. These may be given in the form of a pill or a vaginal  cream. A second round of antibiotics may be prescribed if the condition comes back after treatment. Because bacterial vaginosis increases your risk for sexually transmitted diseases, getting treated can help reduce your risk for chlamydia, gonorrhea, HIV, and herpes. °HOME CARE INSTRUCTIONS  °· Only take over-the-counter or prescription medicines as directed by your health care provider. °· If antibiotic medicine was prescribed, take it as directed. Make sure you finish it even if you start to feel better. °· Tell all sexual partners that you have a vaginal infection. They should see their health care provider and be treated if they have problems, such as a mild rash or itching. °· During treatment, it is important that you follow these instructions: °¨ Avoid sexual activity or use condoms correctly. °¨ Do not douche. °¨ Avoid alcohol as directed by your health care provider. °¨ Avoid breastfeeding as directed by your health care provider. °SEEK MEDICAL CARE IF:  °· Your symptoms are not improving after 3 days of treatment. °· You have increased discharge or pain. °· You have a fever. °MAKE SURE YOU:  °· Understand these instructions. °· Will watch your condition. °· Will get help right away if you are not doing well or get worse. °FOR MORE INFORMATION  °Centers for Disease Control and Prevention, Division of STD Prevention: www.cdc.gov/std °American Sexual Health Association (ASHA): www.ashastd.org  °  °This information is not intended to replace advice given to you by your health care provider. Make sure you discuss any questions you have with your health care provider. °  °Document Released: 05/21/2005 Document Revised: 06/11/2014 Document Reviewed: 12/31/2012 °Elsevier Interactive Patient Education ©2016 [employer] ° °

## 2015-12-12 NOTE — ED Provider Notes (Signed)
CSN: [id number]     Arrival date & time 12/12/15  1305 History   First MD Initiated Contact with Patient 12/12/15 1320     Chief Complaint  Patient presents with  . Abdominal Pain     (Consider location/radiation/quality/duration/timing/severity/associated sxs/prior Treatment) Patient is a 25 y.o. female presenting with abdominal pain. The history is provided by the patient.  Abdominal Pain Pain location:  Suprapubic, LLQ and RLQ Pain quality: cramping and pressure   Pain radiates to:  Does not radiate Pain severity:  Moderate Onset quality:  Gradual Duration:  12 hours Timing:  Constant Progression:  Unchanged Chronicity:  New Relieved by:  Nothing Worsened by:  Nothing tried Ineffective treatments:  None tried Associated symptoms: dysuria and vaginal bleeding   Associated symptoms: no chest pain, no chills, no fever, no nausea, no shortness of breath, no vaginal discharge and no vomiting     25 yo F With a chief complaint of crampy pelvic pain. This been going on since this morning. Feels like the patient's normal menstrual cycle however was 2 weeks early. Denies any discharge. Has had some dysuria and increased frequency and hesitancy. Denies flank pain denies fevers or chills denies vomiting or diarrhea.  Past Medical History  Diagnosis Date  . GERD (gastroesophageal reflux disease)   . Anxiety   . Ovarian cyst   . Vaginal Pap smear, abnormal   . Chlamydia    Past Surgical History  Procedure Laterality Date  . No past surgeries     No family history on file. Social History  Substance Use Topics  . Smoking status: Never Smoker   . Smokeless tobacco: None  . Alcohol Use: No     Comment: occas.   OB History    Gravida Para Term Preterm AB TAB SAB Ectopic Multiple Living   1              Review of Systems  Constitutional: Negative for fever and chills.  HENT: Negative for congestion and rhinorrhea.   Eyes: Negative for redness and visual disturbance.    Respiratory: Negative for shortness of breath and wheezing.   Cardiovascular: Negative for chest pain and palpitations.  Gastrointestinal: Positive for abdominal pain. Negative for nausea and vomiting.  Genitourinary: Positive for dysuria, frequency, vaginal bleeding, menstrual problem and pelvic pain. Negative for urgency, vaginal discharge and vaginal pain.  Musculoskeletal: Negative for myalgias and arthralgias.  Skin: Negative for pallor and wound.  Neurological: Negative for dizziness and headaches.      Allergies  Review of patient's allergies indicates no known allergies.  Home Medications   Prior to Admission medications   Medication Sig Start Date End Date Taking? Authorizing Provider  doxycycline (VIBRAMYCIN) 100 MG capsule Take 1 capsule (100 mg total) by mouth 2 (two) times daily. One po bid x 7 days 12/12/15   Melene Planan Shiri Hodapp, DO  metroNIDAZOLE (FLAGYL) 500 MG tablet Take 1 tablet (500 mg total) by mouth 2 (two) times daily. One po bid x 7 days 12/12/15   Melene Planan Shuntia Exton, DO   BP 125/89 mmHg  Pulse 83  Temp(Src) 98.7 F (37.1 C) (Oral)  Resp 16  Ht 5\' 2"  (1.575 m)  Wt 135 lb (61.236 kg)  BMI 24.69 kg/m2  SpO2 100%  LMP 12/02/2015 Physical Exam  Constitutional: She is oriented to person, place, and time. She appears well-developed and well-nourished. No distress.  HENT:  Head: Normocephalic and atraumatic.  Eyes: EOM are normal. Pupils are equal, round, and reactive to light.  Neck: Normal range of motion. Neck supple.  Cardiovascular: Normal rate and regular rhythm.  Exam reveals no gallop and no friction rub.   No murmur heard. Pulmonary/Chest: Effort normal. She has no wheezes. She has no rales.  Abdominal: Soft. She exhibits no distension. There is tenderness (very mild, lower diffuse). There is no rebound and no guarding.  Genitourinary: Cervix exhibits motion tenderness and discharge. Right adnexum displays no mass and no tenderness. Left adnexum displays no mass and no  tenderness.    Musculoskeletal: She exhibits no edema or tenderness.  Neurological: She is alert and oriented to person, place, and time.  Skin: Skin is warm and dry. She is not diaphoretic.  Psychiatric: She has a normal mood and affect. Her behavior is normal.  Nursing note and vitals reviewed.   ED Course  Procedures (including critical care time) Labs Review Labs Reviewed  WET PREP, GENITAL - Abnormal; Notable for the following:    Clue Cells Wet Prep HPF POC PRESENT (*)    WBC, Wet Prep HPF POC MANY (*)    All other components within normal limits  URINALYSIS, ROUTINE W REFLEX MICROSCOPIC (NOT AT [facility]) - Abnormal; Notable for the following:    APPearance CLOUDY (*)    Hgb urine dipstick LARGE (*)    Protein, ur 100 (*)    Leukocytes, UA LARGE (*)    All other components within normal limits  URINE MICROSCOPIC-ADD ON - Abnormal; Notable for the following:    Squamous Epithelial / LPF 0-5 (*)    Bacteria, UA MANY (*)    All other components within normal limits  PREGNANCY, URINE  GC/CHLAMYDIA PROBE AMP ([place]) NOT AT [facility]    Imaging Review No results found. I have personally reviewed and evaluated these images and lab results as part of my medical decision-making.   EKG Interpretation None      MDM   Final diagnoses:  BV (bacterial vaginosis)  PID (acute pelvic inflammatory disease)  UTI (lower urinary tract infection)    25 yo F With a chief complaints of pelvic pain and vaginal bleeding. No noted bleeding noted on my exam. Patient does have cervical motion tenderness. Will treat for PID.  Patient is currently declining PID treatment. Gyn follow up.   3:24 PM:  I have discussed the diagnosis/risks/treatment options with the patient and family and believe the pt to be eligible for discharge home to follow-up with Gyn. We also discussed returning to the ED immediately if new or worsening sx occur. We discussed the sx which are most concerning (e.g.,  sudden worsening pain, fever, inability to tolerate by mouth) that necessitate immediate return. Medications administered to the patient during their visit and any new prescriptions provided to the patient are listed below.  Medications given during this visit Medications  ibuprofen (ADVIL,MOTRIN) tablet 800 mg (800 mg Oral Not Given 12/12/15 1416)  acetaminophen (TYLENOL) tablet 1,000 mg (1,000 mg Oral Given 12/12/15 1415)  cefTRIAXone (ROCEPHIN) injection 250 mg (250 mg Intramuscular Given 12/12/15 1421)  doxycycline (VIBRA-TABS) tablet 100 mg (100 mg Oral Given 12/12/15 1417)  fosfomycin (MONUROL) packet 3 g (3 g Oral Given 12/12/15 1427)  lidocaine (PF) (XYLOCAINE) 1 % injection (0.9 mLs  Given 12/12/15 1422)    Discharge Medication List as of 12/12/2015  2:20 PM    START taking these medications   Details  doxycycline (VIBRAMYCIN) 100 MG capsule Take 1 capsule (100 mg total) by mouth 2 (two) times daily. One po bid x  7 days, Starting 12/12/2015, Until Discontinued, Print    metroNIDAZOLE (FLAGYL) 500 MG tablet Take 1 tablet (500 mg total) by mouth 2 (two) times daily. One po bid x 7 days, Starting 12/12/2015, Until Discontinued, Print        The patient appears reasonably screen and/or stabilized for discharge and I doubt any other medical condition or other [facility] requiring further screening, evaluation, or treatment in the ED at this time prior to discharge.    Melene Plan, DO 12/12/15 1524

## 2015-12-13 LAB — GC/CHLAMYDIA PROBE AMP (~~LOC~~) NOT AT ARMC
Chlamydia: NEGATIVE
NEISSERIA GONORRHEA: NEGATIVE

## 2015-12-19 ENCOUNTER — Telehealth (HOSPITAL_BASED_OUTPATIENT_CLINIC_OR_DEPARTMENT_OTHER): Payer: Self-pay

## 2015-12-28 ENCOUNTER — Ambulatory Visit: Payer: Medicaid Other

## 2016-09-10 ENCOUNTER — Encounter (HOSPITAL_BASED_OUTPATIENT_CLINIC_OR_DEPARTMENT_OTHER): Payer: Self-pay | Admitting: *Deleted

## 2016-09-10 ENCOUNTER — Emergency Department (HOSPITAL_BASED_OUTPATIENT_CLINIC_OR_DEPARTMENT_OTHER): Payer: Medicaid Other

## 2016-09-10 ENCOUNTER — Emergency Department (HOSPITAL_BASED_OUTPATIENT_CLINIC_OR_DEPARTMENT_OTHER)
Admission: EM | Admit: 2016-09-10 | Discharge: 2016-09-10 | Disposition: A | Discharge status: Home / Self Care | Payer: Medicaid Other | Attending: Emergency Medicine | Admitting: Emergency Medicine

## 2016-09-10 DIAGNOSIS — N72 Inflammatory disease of cervix uteri: Secondary | ICD-10-CM | POA: Insufficient documentation

## 2016-09-10 LAB — CBC
HCT: 38.3 % (ref 36.0–46.0)
Hemoglobin: 13 g/dL (ref 12.0–15.0)
MCH: 29.5 pg (ref 26.0–34.0)
MCHC: 33.9 g/dL (ref 30.0–36.0)
MCV: 86.8 fL (ref 78.0–100.0)
PLATELETS: 290 10*3/uL (ref 150–400)
RBC: 4.41 MIL/uL (ref 3.87–5.11)
RDW: 13.6 % (ref 11.5–15.5)
WBC: 5.5 10*3/uL (ref 4.0–10.5)

## 2016-09-10 LAB — COMPREHENSIVE METABOLIC PANEL
ALK PHOS: 39 U/L (ref 38–126)
ALT: 8 U/L — ABNORMAL LOW (ref 14–54)
AST: 21 U/L (ref 15–41)
Albumin: 4 g/dL (ref 3.5–5.0)
Anion gap: 8 (ref 5–15)
BILIRUBIN TOTAL: 0.3 mg/dL (ref 0.3–1.2)
BUN: 10 mg/dL (ref 6–20)
CO2: 23 mmol/L (ref 22–32)
Calcium: 9.1 mg/dL (ref 8.9–10.3)
Chloride: 104 mmol/L (ref 101–111)
Creatinine, Ser: 0.74 mg/dL (ref 0.44–1.00)
GFR calc Af Amer: >60 mL/min (ref >60)
GFR calc non Af Amer: >60 mL/min (ref >60)
GLUCOSE: 120 mg/dL — AB (ref 65–99)
POTASSIUM: 3.4 mmol/L — AB (ref 3.5–5.1)
Sodium: 135 mmol/L (ref 135–145)
TOTAL PROTEIN: 7.9 g/dL (ref 6.5–8.1)

## 2016-09-10 LAB — WET PREP, GENITAL
Clue Cells Wet Prep HPF POC: NONE SEEN
Sperm: NONE SEEN
TRICH WET PREP: NONE SEEN

## 2016-09-10 LAB — URINALYSIS, MICROSCOPIC (REFLEX)

## 2016-09-10 LAB — URINALYSIS, ROUTINE W REFLEX MICROSCOPIC
Bilirubin Urine: NEGATIVE
Glucose, UA: NEGATIVE mg/dL
Hgb urine dipstick: NEGATIVE
KETONES UR: NEGATIVE mg/dL
NITRITE: NEGATIVE
PROTEIN: NEGATIVE mg/dL
Specific Gravity, Urine: 1.018 (ref 1.005–1.030)
pH: 5 (ref 5.0–8.0)

## 2016-09-10 LAB — LIPASE, BLOOD: Lipase: 28 U/L (ref 11–51)

## 2016-09-10 LAB — PREGNANCY, URINE: PREG TEST UR: NEGATIVE

## 2016-09-10 MED ORDER — LIDOCAINE HCL (PF) 1 % IJ SOLN
INTRAMUSCULAR | Status: AC
  Administered 2016-09-10: 0.9 mL
  Filled 2016-09-10: qty 5

## 2016-09-10 MED ORDER — FLUCONAZOLE 100 MG PO TABS
200.0000 mg | ORAL_TABLET | Freq: Once | ORAL | Status: AC
  Administered 2016-09-10: 200 mg via ORAL
  Filled 2016-09-10: qty 2

## 2016-09-10 MED ORDER — CEFTRIAXONE SODIUM 250 MG IJ SOLR
250.0000 mg | Freq: Once | INTRAMUSCULAR | Status: AC
  Administered 2016-09-10: 250 mg via INTRAMUSCULAR
  Filled 2016-09-10: qty 250

## 2016-09-10 MED ORDER — AZITHROMYCIN 250 MG PO TABS
1000.0000 mg | ORAL_TABLET | Freq: Once | ORAL | Status: AC
  Administered 2016-09-10: 1000 mg via ORAL
  Filled 2016-09-10: qty 4

## 2016-09-10 NOTE — ED Triage Notes (Signed)
Left quadrant pain, dysuria, nausea, and missed menses.

## 2016-09-10 NOTE — ED Provider Notes (Addendum)
[facility] Provider Note   CSN: [id number] Arrival date & time: 09/10/16  1725  By signing my name below, I, Modena Jansky, attest that this documentation has been prepared under the direction and in the presence of Rolan Bucco, MD. Electronically Signed: Modena Jansky, Scribe. 09/10/2016. 6:38 PM.  History   Chief Complaint Chief Complaint  Patient presents with  . Abdominal Pain  . Emesis   The history is provided by the patient. No language interpreter was used.    HPI Comments: Robin Lloyd is a 26 y.o. female who presents to the Emergency Department complaining of urinary frequency that started a few days ago. No modifying factors. She reports associated left-sided abdominal pain (similar to prior episodes) and urinary urgency. LNMP: 08/05/16. She is currently sexually active. Denies any hx of STD, vaginal bleeding/discharge, or other complaints at this time.  Past Medical History:  Diagnosis Date  . Anxiety   . Chlamydia   . GERD (gastroesophageal reflux disease)   . Ovarian cyst   . Vaginal Pap smear, abnormal     Patient Active Problem List   Diagnosis Date Noted  . Non-viable pregnancy 06/09/2014  . Chlamydia infection affecting pregnancy--dx 11/24, no treatment until 12/14, still positive 1/2--treated 1/2 in [facility], still positive on 06/05/14 test. 06/08/2014  . ROM (rupture of membranes), premature   . Echogenic focus of bowel of fetal affecting antepartum care of mother   . [redacted] weeks gestation of pregnancy   . Preterm premature rupture of membranes (PPROM) with unknown onset of labor 06/05/2014  . Underweight 06/05/2014  . GERD (gastroesophageal reflux disease) 06/05/2014  . History of anemia 06/05/2014  . Anxiety state 06/05/2014  . Ovarian cyst 06/05/2014  . Abnormal Pap smear of cervix 06/05/2014    Past Surgical History:  Procedure Laterality Date  . NO PAST SURGERIES      OB History    Gravida Para Term Preterm AB Living   1             SAB TAB Ectopic Multiple Live Births                   Home Medications    Prior to Admission medications   Medication Sig Start Date End Date Taking? Authorizing Provider  doxycycline (VIBRAMYCIN) 100 MG capsule Take 1 capsule (100 mg total) by mouth 2 (two) times daily. One po bid x 7 days 12/12/15   Melene Plan, DO  metroNIDAZOLE (FLAGYL) 500 MG tablet Take 1 tablet (500 mg total) by mouth 2 (two) times daily. One po bid x 7 days 12/12/15   Melene Plan, DO    Family History No family history on file.  Social History Social History  Substance Use Topics  . Smoking status: Never Smoker  . Smokeless tobacco: Never Used  . Alcohol use No     Comment: occas.     Allergies   Patient has no known allergies.   Review of Systems Review of Systems  Constitutional: Negative for chills, diaphoresis, fatigue and fever.  HENT: Negative for congestion, rhinorrhea and sneezing.   Eyes: Negative.   Respiratory: Negative for cough, chest tightness and shortness of breath.   Cardiovascular: Negative for chest pain and leg swelling.  Gastrointestinal: Positive for abdominal pain (Left-sided). Negative for blood in stool, diarrhea, nausea and vomiting.  Genitourinary: Positive for frequency and urgency. Negative for difficulty urinating, flank pain, hematuria, vaginal bleeding and vaginal discharge.  Musculoskeletal: Negative for arthralgias and back pain.  Skin: Negative for rash.  Neurological: Negative for dizziness, speech difficulty, weakness, numbness and headaches.     Physical Exam Updated Vital Signs BP 114/86   Pulse 89   Temp 98.4 F (36.9 C) (Oral)   Resp 16   Ht  (1.575 m)   Wt 135 lb (61.2 kg)   LMP 08/05/2016   SpO2 100%   BMI 24.69 kg/m   Physical Exam  Constitutional: She is oriented to person, place, and time. She appears well-developed and well-nourished.  HENT:  Head: Normocephalic and atraumatic.  Eyes: Pupils are equal, round, and reactive to light.    Neck: Normal range of motion. Neck supple.  Cardiovascular: Normal rate, regular rhythm and normal heart sounds.   Pulmonary/Chest: Effort normal and breath sounds normal. No respiratory distress. She has no wheezes. She has no rales. She exhibits no tenderness.  Abdominal: Soft. Bowel sounds are normal. There is no tenderness. There is no rebound and no guarding.  Genitourinary:  Genitourinary Comments: Patient has moderate amount of thin white vaginal discharge. No cervical motion tenderness or adnexal tenderness. No rash or lesions noted.  Musculoskeletal: Normal range of motion. She exhibits no edema.  Lymphadenopathy:    She has no cervical adenopathy.  Neurological: She is alert and oriented to person, place, and time.  Skin: Skin is warm and dry. No rash noted.  Psychiatric: She has a normal mood and affect.     ED Treatments / Results  DIAGNOSTIC STUDIES: Oxygen Saturation is 100% on RA, normal by my interpretation.    COORDINATION OF CARE: 6:44 PM- Pt advised of plan for treatment and pt agrees.  Labs (all labs ordered are listed, but only abnormal results are displayed) Labs Reviewed  WET PREP, GENITAL - Abnormal; Notable for the following:       Result Value   Yeast Wet Prep HPF POC PRESENT (*)    WBC, Wet Prep HPF POC MANY (*)    All other components within normal limits  URINALYSIS, ROUTINE W REFLEX MICROSCOPIC - Abnormal; Notable for the following:    Leukocytes, UA SMALL (*)    All other components within normal limits  URINALYSIS, MICROSCOPIC (REFLEX) - Abnormal; Notable for the following:    Bacteria, UA RARE (*)    Squamous Epithelial / LPF 0-5 (*)    All other components within normal limits  COMPREHENSIVE METABOLIC PANEL - Abnormal; Notable for the following:    Potassium 3.4 (*)    Glucose, Bld 120 (*)    ALT 8 (*)    All other components within normal limits  URINE CULTURE  PREGNANCY, URINE  LIPASE, BLOOD  CBC  RPR  HIV ANTIBODY (ROUTINE TESTING)   GC/CHLAMYDIA PROBE AMP ([place]) NOT AT [facility]    EKG  EKG Interpretation None       Radiology Dg Abdomen 1 View  Result Date: 09/10/2016 CLINICAL DATA:  Left-sided abdominal pain EXAM: ABDOMEN - 1 VIEW COMPARISON:  None. FINDINGS: Scattered large and small bowel gas is noted. Fecal material is noted throughout the colon. No obstructive changes are seen. No bony abnormality is noted. IMPRESSION: No acute abnormality seen. Electronically Signed   By: Alcide Clever M.D.   On: 09/10/2016 19:06    Procedures Procedures (including critical care time)  Medications Ordered in ED Medications  cefTRIAXone (ROCEPHIN) injection 250 mg (250 mg Intramuscular Given 09/10/16 1928)  azithromycin (ZITHROMAX) tablet 1,000 mg (1,000 mg Oral Given 09/10/16 1927)  lidocaine (PF) (XYLOCAINE) 1 % injection (  5 mLs  Given 09/10/16 1929)     Initial Impression / Assessment and Plan / ED Course  I have reviewed the triage vital signs and the nursing notes.  Pertinent labs & imaging results that were available during my care of the patient were reviewed by me and considered in my medical decision making (see chart for details).     Patient presents with dysuria and vaginal discharge. She was treated with Rocephin and Zithromax. Her wet prep is negative. She was discharged home in good condition. Return precautions were given.  Her left side abdominal pain seems consistent with her prior episodes of constipation. Her KUB is unremarkable. Her labs are non-concerning. She was given education about treatment of constipation and taken daily fiber supplement. She did not have any abdominal tenderness on exam. Don't feel that further imaging is indicated.  Final Clinical Impressions(s) / ED Diagnoses   Final diagnoses:  Cervicitis    New Prescriptions New Prescriptions   No medications on file   I personally performed the services described in this documentation, which was scribed in my presence.  The  recorded information has been reviewed and considered.     Rolan Bucco, MD 09/10/16 1610    Rolan Bucco, MD 09/10/16 [id number]

## 2016-09-10 NOTE — ED Notes (Signed)
Pt verbalizes understanding of d/c instructions and denies any further needs at this time. 

## 2016-09-11 LAB — HIV ANTIBODY (ROUTINE TESTING W REFLEX): HIV Screen 4th Generation wRfx: NONREACTIVE

## 2016-09-11 LAB — GC/CHLAMYDIA PROBE AMP (~~LOC~~) NOT AT ARMC
Chlamydia: POSITIVE — AB
NEISSERIA GONORRHEA: NEGATIVE

## 2016-09-11 LAB — RPR: RPR: NONREACTIVE

## 2016-09-12 LAB — URINE CULTURE

## 2016-09-13 ENCOUNTER — Telehealth: Payer: Self-pay | Admitting: *Deleted

## 2016-09-13 NOTE — Telephone Encounter (Signed)
Post ED Visit - Positive Culture Follow-up  Culture report reviewed by antimicrobial stewardship pharmacist:   Enzo Bi, Pharm.D.  Celedonio Miyamoto, Pharm.D., BCPS AQ-ID  Garvin Fila, Pharm.D., BCPS  Georgina Pillion, Pharm.D., BCPS  [place], [address].D., BCPS, AAHIVP  Estella Husk, Pharm.D., BCPS, AAHIVP  Lysle Pearl, PharmD, BCPS  Casilda Carls, PharmD, BCPS  Pollyann Samples, PharmD, BCPS  Positive urine culture No further patient follow-up is required at this time.  Virl Axe [facility] 09/13/2016, 11:10 AM

## 2017-06-04 NOTE — L&D Delivery Note (Signed)
Delivery Note At 3:47 PM a viable female was delivered via Vaginal, Spontaneous (Presentation: ;  ).  APGAR: 9, 9; weight  .   Placenta status: , .  Cord:  with the following complications: .  Cord pH:   Anesthesia:   Episiotomy: None Lacerations: 2nd degree;Perineal Suture Repair: 2.0 chromic Est. Blood Loss (mL): 340  Mom to postpartum.  Baby to Couplet care / Skin to Skin.  Janequa Kipnis A 04/24/2018, 4:18 PM

## 2017-10-01 ENCOUNTER — Emergency Department (HOSPITAL_COMMUNITY): Payer: Medicaid Other

## 2017-10-01 ENCOUNTER — Encounter (HOSPITAL_COMMUNITY): Payer: Self-pay | Admitting: Emergency Medicine

## 2017-10-01 ENCOUNTER — Emergency Department (HOSPITAL_COMMUNITY)
Admission: EM | Admit: 2017-10-01 | Discharge: 2017-10-01 | Disposition: A | Discharge status: Home / Self Care | Payer: Medicaid Other | Attending: Emergency Medicine | Admitting: Emergency Medicine

## 2017-10-01 DIAGNOSIS — N739 Female pelvic inflammatory disease, unspecified: Secondary | ICD-10-CM

## 2017-10-01 DIAGNOSIS — Z3491 Encounter for supervision of normal pregnancy, unspecified, first trimester: Secondary | ICD-10-CM | POA: Diagnosis not present

## 2017-10-01 DIAGNOSIS — K047 Periapical abscess without sinus: Secondary | ICD-10-CM

## 2017-10-01 DIAGNOSIS — Z3A10 10 weeks gestation of pregnancy: Secondary | ICD-10-CM | POA: Diagnosis not present

## 2017-10-01 DIAGNOSIS — Z79899 Other long term (current) drug therapy: Secondary | ICD-10-CM | POA: Diagnosis not present

## 2017-10-01 DIAGNOSIS — O26899 Other specified pregnancy related conditions, unspecified trimester: Secondary | ICD-10-CM | POA: Diagnosis not present

## 2017-10-01 DIAGNOSIS — R102 Pelvic and perineal pain: Secondary | ICD-10-CM

## 2017-10-01 LAB — COMPREHENSIVE METABOLIC PANEL
ALBUMIN: 3.8 g/dL (ref 3.5–5.0)
ALT: 9 U/L — ABNORMAL LOW (ref 14–54)
ANION GAP: 9 (ref 5–15)
AST: 19 U/L (ref 15–41)
Alkaline Phosphatase: 29 U/L — ABNORMAL LOW (ref 38–126)
BUN: 6 mg/dL (ref 6–20)
CO2: 22 mmol/L (ref 22–32)
Calcium: 9.6 mg/dL (ref 8.9–10.3)
Chloride: 103 mmol/L (ref 101–111)
Creatinine, Ser: 0.53 mg/dL (ref 0.44–1.00)
GFR calc non Af Amer: >60 mL/min (ref >60)
GLUCOSE: 92 mg/dL (ref 65–99)
POTASSIUM: 3.6 mmol/L (ref 3.5–5.1)
SODIUM: 134 mmol/L — AB (ref 135–145)
TOTAL PROTEIN: 7.8 g/dL (ref 6.5–8.1)
Total Bilirubin: 0.2 mg/dL — ABNORMAL LOW (ref 0.3–1.2)

## 2017-10-01 LAB — URINALYSIS, ROUTINE W REFLEX MICROSCOPIC
Bilirubin Urine: NEGATIVE
Glucose, UA: NEGATIVE mg/dL
Hgb urine dipstick: NEGATIVE
KETONES UR: NEGATIVE mg/dL
NITRITE: NEGATIVE
PROTEIN: NEGATIVE mg/dL
Specific Gravity, Urine: 1.025 (ref 1.005–1.030)
pH: 6 (ref 5.0–8.0)

## 2017-10-01 LAB — WET PREP, GENITAL
Clue Cells Wet Prep HPF POC: NONE SEEN
SPERM: NONE SEEN
Trich, Wet Prep: NONE SEEN
YEAST WET PREP: NONE SEEN

## 2017-10-01 LAB — CBC
HEMATOCRIT: 33.5 % — AB (ref 36.0–46.0)
HEMOGLOBIN: 11.2 g/dL — AB (ref 12.0–15.0)
MCH: 28.9 pg (ref 26.0–34.0)
MCHC: 33.4 g/dL (ref 30.0–36.0)
MCV: 86.3 fL (ref 78.0–100.0)
Platelets: 239 10*3/uL (ref 150–400)
RBC: 3.88 MIL/uL (ref 3.87–5.11)
RDW: 13.9 % (ref 11.5–15.5)
WBC: 5.6 10*3/uL (ref 4.0–10.5)

## 2017-10-01 LAB — HCG, QUANTITATIVE, PREGNANCY: HCG, BETA CHAIN, QUANT, S: 92807 m[IU]/mL — AB (ref <5)

## 2017-10-01 LAB — LIPASE, BLOOD: Lipase: 30 U/L (ref 11–51)

## 2017-10-01 MED ORDER — AZITHROMYCIN 250 MG PO TABS
1000.0000 mg | ORAL_TABLET | Freq: Once | ORAL | Status: AC
  Administered 2017-10-01: 1000 mg via ORAL
  Filled 2017-10-01: qty 4

## 2017-10-01 MED ORDER — LIDOCAINE HCL 1 % IJ SOLN
INTRAMUSCULAR | Status: AC
  Administered 2017-10-01: 20 mL
  Filled 2017-10-01: qty 20

## 2017-10-01 MED ORDER — CEFTRIAXONE SODIUM 250 MG IJ SOLR
250.0000 mg | Freq: Once | INTRAMUSCULAR | Status: AC
  Administered 2017-10-01: 250 mg via INTRAMUSCULAR
  Filled 2017-10-01: qty 250

## 2017-10-01 MED ORDER — PENICILLIN V POTASSIUM 500 MG PO TABS: 500.0000 mg | ORAL_TABLET | Freq: Four times a day (QID) | ORAL | 0 refills | Status: AC

## 2017-10-01 NOTE — Discharge Instructions (Addendum)
Ultrasound confirms a single intrauterine pregnancy approximately 10 week 3 days. Expected delivery April 26 2018. Pelvic pain most likely from pelvic inflammatory disease. You were treated for this. Gonorrhea and chlamydia test results are pending, you will be called if positive results. Pelvic pain should improve slowly over the next week or so. Return for worsening pain, abnormal vaginal bleeding. Continue prenatal vitamins. Avoid ibuprofen aleve or advil during pregnancy.  Penicillin for dental abscess. Warm salt water rinses. Follow up with dentist.

## 2017-10-01 NOTE — ED Triage Notes (Signed)
Pt c/o left dental pain and swelling x2 days and believes she has dental abscess. Also c/o abd pains lower and LLQ pains since Saturday. Reports is pregnant, but doesn't have first PB appt til may 14. Denies n/v/d.

## 2017-10-01 NOTE — ED Provider Notes (Signed)
[facility]-EMERGENCY DEPT Provider Note   CSN: [id number] Arrival date & time: 10/01/17  1009     History   Chief Complaint Chief Complaint  Patient presents with  . Dental Pain  . Abdominal Pain    HPI Robin Lloyd is a 27 y.o. female G2, P0 history of miscarriage at 53 months gestational age, chlamydia, GERD here with 2 concerns.  Left lower jaw dental pain for 2 days.  Associated symptoms include mild local swelling to the face and palpable fever yesterday.  Has noticed an odd taste when she rubs her tongue over this area.  No trismus, odynophagia, voice changes, neck swelling or stiffness.  Has a known broken tooth to the side that she suspects is infected.  No dental coverage or provider.  No interventions PTA.  Alleviating factors: none. No interventions. Aggravating factors include palpation.  Onset: Acute, constant.  Suprapubic abdominal pain for the last 2 weeks.  Pain radiates to the left lower quadrant.  Pain is described as constant, not improving.  Associated with increased thin white vaginal discharge, nausea. Aggravating factors include palpation.  No interventions for this.  No alleviating factors.  She thinks she is at least 2 months pregnant, has taken urine pregnancy test.  LMP 07/17/2017.  She is taking prenatal vitamins.  She has had no vomiting, dysuria, hematuria, vaginal bleeding, flank pain or back pain.  Has first OB/GYN appointment on 5/14. H/o chlamydia and PID. Sexually active with men only.   HPI  Past Medical History:  Diagnosis Date  . Anxiety   . Chlamydia   . GERD (gastroesophageal reflux disease)   . Ovarian cyst   . Vaginal Pap smear, abnormal     Patient Active Problem List   Diagnosis Date Noted  . Non-viable pregnancy 06/09/2014  . Chlamydia infection affecting pregnancy--dx 11/24, no treatment until 12/14, still positive 1/2--treated 1/2 in MAU, still positive on 06/05/14 test. 06/08/2014  . ROM (rupture of  membranes), premature   . Echogenic focus of bowel of fetal affecting antepartum care of mother   . [redacted] weeks gestation of pregnancy   . Preterm premature rupture of membranes (PPROM) with unknown onset of labor 06/05/2014  . Underweight 06/05/2014  . GERD (gastroesophageal reflux disease) 06/05/2014  . History of anemia 06/05/2014  . Anxiety state 06/05/2014  . Ovarian cyst 06/05/2014  . Abnormal Pap smear of cervix 06/05/2014    Past Surgical History:  Procedure Laterality Date  . NO PAST SURGERIES       OB History    Gravida  2   Para      Term      Preterm      AB      Living        SAB      TAB      Ectopic      Multiple      Live Births               Home Medications    Prior to Admission medications   Medication Sig Start Date End Date Taking? Authorizing Provider  ibuprofen (ADVIL,MOTRIN) 200 MG tablet Take 400 mg by mouth daily as needed for moderate pain.   Yes [provider]  Prenatal Vit-Fe Fumarate-FA (PRENATAL PO) Take 1 tablet by mouth daily.   Yes [provider]  doxycycline (VIBRAMYCIN) 100 MG capsule Take 1 capsule (100 mg total) by mouth 2 (two) times daily. One po bid x 7 days  Patient not taking: Reported on 10/01/2017 12/12/15   Melene Plan, DO  metroNIDAZOLE (FLAGYL) 500 MG tablet Take 1 tablet (500 mg total) by mouth 2 (two) times daily. One po bid x 7 days Patient not taking: Reported on 10/01/2017 12/12/15   Melene Plan, DO  penicillin v potassium (VEETID) 500 MG tablet Take 1 tablet (500 mg total) by mouth 4 (four) times daily for 7 days. 10/01/17 10/08/17  Liberty Handy, PA-C    Family History No family history on file.  Social History Social History   Tobacco Use  . Smoking status: Never Smoker  . Smokeless tobacco: Never Used  Substance Use Topics  . Alcohol use: No    Comment: occas.  . Drug use: No     Allergies   Patient has no known allergies.   Review of Systems Review of Systems  HENT:  Positive for dental problem and facial swelling.   Gastrointestinal: Positive for abdominal pain and nausea.  Genitourinary:       Positive pregnancy test   All other systems reviewed and are negative.    Physical Exam Updated Vital Signs BP 115/78   Pulse 95   Temp 98.1 F (36.7 C) (Oral)   Resp 20   Ht  (1.575 m)   Wt 61.2 kg (135 lb)   LMP 07/21/2017   SpO2 100%   BMI 24.69 kg/m   Physical Exam  Constitutional: She is oriented to person, place, and time. She appears well-developed and well-nourished. No distress.  Non toxic  HENT:  Head: Normocephalic and atraumatic.  Nose: Nose normal.  Mouth/Throat: No oropharyngeal exudate.  Tooth #19 is cracked there is focal tenderness, erythema, edema along the buccal aspect of gingiva.  Mild pressure produces purulent drainage.  Tooth #29 also cracked, no signs of infection to surrounding gumline.  No trismus.  No stridor.  Controlling secretions.  Mild left lower jaw edema with tenderness.  Eyes: Pupils are equal, round, and reactive to light. Conjunctivae and EOM are normal.  Neck: Normal range of motion.  Cardiovascular: Normal rate, regular rhythm and intact distal pulses.  No murmur heard. 2+ DP and radial pulses bilaterally. No LE edema.   Pulmonary/Chest: Effort normal and breath sounds normal. No respiratory distress. She has no wheezes. She has no rales.  Abdominal: Soft. Bowel sounds are normal. There is tenderness in the suprapubic area and left lower quadrant.  No G/R/R. No CVA tenderness.   Genitourinary: Cervix exhibits motion tenderness. Vaginal discharge found.  Genitourinary Comments:  EMT present during exam External genitalia normal without erythema, edema, tenderness or lesions.  Moderate amount of white, thin discharge to introitus. No groin lymphadenopathy. Vaginal mucosa and cervix pink without lesions. Moderate amount of thin, white discharge in vaginal vault and around cervix. Cervix is  closed. Mild CMT.  Nonpalpable nontender adnexa.  Musculoskeletal: Normal range of motion. She exhibits no deformity.  Neurological: She is alert and oriented to person, place, and time.  Skin: Skin is warm and dry. Capillary refill takes less than 2 seconds.  Psychiatric: She has a normal mood and affect. Her behavior is normal. Judgment and thought content normal.  Nursing note and vitals reviewed.    ED Treatments / Results  Labs (all labs ordered are listed, but only abnormal results are displayed) Labs Reviewed  WET PREP, GENITAL - Abnormal; Notable for the following components:      Result Value   WBC, Wet Prep HPF POC MODERATE (*)  All other components within normal limits  COMPREHENSIVE METABOLIC PANEL - Abnormal; Notable for the following components:   Sodium 134 (*)    ALT 9 (*)    Alkaline Phosphatase 29 (*)    Total Bilirubin 0.2 (*)    All other components within normal limits  CBC - Abnormal; Notable for the following components:   Hemoglobin 11.2 (*)    HCT 33.5 (*)    All other components within normal limits  URINALYSIS, ROUTINE W REFLEX MICROSCOPIC - Abnormal; Notable for the following components:   APPearance HAZY (*)    Leukocytes, UA LARGE (*)    Bacteria, UA FEW (*)    All other components within normal limits  HCG, QUANTITATIVE, PREGNANCY - Abnormal; Notable for the following components:   hCG, Beta Chain, Quant, S 92,807 (*)    All other components within normal limits  URINE CULTURE  LIPASE, BLOOD  GC/CHLAMYDIA PROBE AMP ([place]) NOT AT [facility]    EKG None  Radiology US Ob Comp < 14 Wks  Result Date: 10/01/2017 CLINICAL DATA:  First trimester pregnancy, left lower quadrant abdominal pain. EXAM: OBSTETRIC <14 WK [place] AND TRANSVAGINAL OB US TECHNIQUE: Both transabdominal and transvaginal ultrasound examinations were performed for complete evaluation of the gestation as well as the maternal uterus, adnexal regions, and pelvic cul-de-sac.  Transvaginal technique was performed to assess early pregnancy. COMPARISON:  Ultrasound of August 29, 2015. FINDINGS: Intrauterine gestational sac: Single visualized. Yolk sac:  Not visualized. Embryo:  Visualized. Cardiac Activity: Visualized. Heart Rate: 165 bpm CRL: 35.3 mm 10 w 3 d [place] [facility]: April 26, 2018. Subchorionic hemorrhage:  None visualized. Maternal uterus/adnexae: Ovaries appear normal. No free fluid is noted. IMPRESSION: Single live intrauterine gestation of 10 weeks 3 days. Electronically Signed   By: Lupita Raider, M.D.   On: 10/01/2017 17:10   US Ob Transvaginal  Result Date: 10/01/2017 CLINICAL DATA:  First trimester pregnancy, left lower quadrant abdominal pain. EXAM: OBSTETRIC <14 WK [place] AND TRANSVAGINAL OB US TECHNIQUE: Both transabdominal and transvaginal ultrasound examinations were performed for complete evaluation of the gestation as well as the maternal uterus, adnexal regions, and pelvic cul-de-sac. Transvaginal technique was performed to assess early pregnancy. COMPARISON:  Ultrasound of August 29, 2015. FINDINGS: Intrauterine gestational sac: Single visualized. Yolk sac:  Not visualized. Embryo:  Visualized. Cardiac Activity: Visualized. Heart Rate: 165 bpm CRL: 35.3 mm 10 w 3 d [place] [facility]: April 26, 2018. Subchorionic hemorrhage:  None visualized. Maternal uterus/adnexae: Ovaries appear normal. No free fluid is noted. IMPRESSION: Single live intrauterine gestation of 10 weeks 3 days. Electronically Signed   By: Lupita Raider, M.D.   On: 10/01/2017 17:10    Procedures Procedures (including critical care time)  Medications Ordered in ED Medications  cefTRIAXone (ROCEPHIN) injection 250 mg (has no administration in time range)  azithromycin (ZITHROMAX) tablet 1,000 mg (has no administration in time range)     Initial Impression / Assessment and Plan / ED Course  I have reviewed the triage vital signs and the nursing notes.  Pertinent labs & imaging results that were  available during my care of the patient were reviewed by me and considered in my medical decision making (see chart for details).  Clinical Course as of Oct 01 1733  Tue Oct 01, 2017  1404 Leukocytes, UA(!): LARGE [CG]  1406 WBC, UA: 11-20 [CG]    Clinical Course User Index [CG] Liberty Handy, PA-C   27 year old female G2, P0 history of  miscarriage here for dental pain and abdominal pain.  In regards to dental pain, there is an obvious dental abscess that was drained in the ED.  Will discharge with antibiotics, salt water rinses, dental follow-up.  Recommended ibuprofen avoidance given with first trimester pregnancy.  On exam, she is nontoxic.  Abdomen is soft, nondistended without peritonitis.  She has focal suprapubic and left lower quadrant tenderness.  No CVA tenderness.   Labs reviewed and unremarkable for age CG greater than 92,000.  Urinalysis with large leukocytes, 11-20 WBCs, few bacteria which can explain suprapubic tenderness.  CMP, lipase WNL.  Will perform a pelvic exam and pelvic ultrasound.  Final Clinical Impressions(s) / ED Diagnoses  1720: Pelvic exam remarkable for CMT, moderate discharge in vaginal vault.  Ultrasound confirms single intrauterine pregnancy.  Given history of chlamydia, PID in the past, pelvic pain and CMT today high suspicion for PID during first trimester pregnancy.  Spoke to OBGYN recommends ceftriaxone/azithromycin in ED and dc with OBGYN f/u. She has scheduled appt with [facility].  Vital signs have remained stable.  She is tolerating food in the ED.  Repeat abdominal exam reassuring, non tender. She is eating.  Final diagnoses:  First trimester pregnancy  Pelvic inflammatory disease  Pelvic pain in pregnancy  Dental abscess    ED Discharge Orders        Ordered    penicillin v potassium (VEETID) 500 MG tablet  4 times daily     10/01/17 1733       Liberty Handy, [place] 10/01/17 1736    Arby Barrette, MD 10/04/17 1430

## 2017-10-01 NOTE — ED Notes (Signed)
Two gold tube in main lab

## 2017-10-02 LAB — URINE CULTURE: CULTURE: NO GROWTH

## 2017-10-02 LAB — GC/CHLAMYDIA PROBE AMP (~~LOC~~) NOT AT ARMC
Chlamydia: POSITIVE — AB
Neisseria Gonorrhea: NEGATIVE

## 2017-10-21 LAB — OB RESULTS CONSOLE HEPATITIS B SURFACE ANTIGEN: Hepatitis B Surface Ag: NEGATIVE

## 2017-10-21 LAB — OB RESULTS CONSOLE GC/CHLAMYDIA
CHLAMYDIA, DNA PROBE: NEGATIVE
GC PROBE AMP, GENITAL: NEGATIVE

## 2017-10-21 LAB — OB RESULTS CONSOLE HIV ANTIBODY (ROUTINE TESTING): HIV: NONREACTIVE

## 2017-10-21 LAB — OB RESULTS CONSOLE ABO/RH: RH TYPE: POSITIVE

## 2017-10-21 LAB — OB RESULTS CONSOLE RUBELLA ANTIBODY, IGM: Rubella: IMMUNE

## 2017-10-21 LAB — OB RESULTS CONSOLE RPR: RPR: NONREACTIVE

## 2017-10-22 DIAGNOSIS — Z3490 Encounter for supervision of normal pregnancy, unspecified, unspecified trimester: Secondary | ICD-10-CM | POA: Insufficient documentation

## 2017-10-25 DIAGNOSIS — D649 Anemia, unspecified: Secondary | ICD-10-CM | POA: Insufficient documentation

## 2017-10-25 DIAGNOSIS — O09292 Supervision of pregnancy with other poor reproductive or obstetric history, second trimester: Secondary | ICD-10-CM | POA: Insufficient documentation

## 2017-11-14 DIAGNOSIS — O0930 Supervision of pregnancy with insufficient antenatal care, unspecified trimester: Secondary | ICD-10-CM | POA: Insufficient documentation

## 2017-12-10 DIAGNOSIS — A599 Trichomoniasis, unspecified: Secondary | ICD-10-CM | POA: Insufficient documentation

## 2018-02-27 ENCOUNTER — Inpatient Hospital Stay (HOSPITAL_COMMUNITY)
Admission: AD | Admit: 2018-02-27 | Discharge: 2018-02-27 | Disposition: A | Discharge status: Home / Self Care | Payer: Medicaid Other | Source: Ambulatory Visit | Attending: Obstetrics and Gynecology | Admitting: Obstetrics and Gynecology

## 2018-02-27 ENCOUNTER — Encounter (HOSPITAL_COMMUNITY): Payer: Self-pay | Admitting: *Deleted

## 2018-02-27 DIAGNOSIS — N898 Other specified noninflammatory disorders of vagina: Secondary | ICD-10-CM | POA: Insufficient documentation

## 2018-02-27 DIAGNOSIS — R102 Pelvic and perineal pain: Secondary | ICD-10-CM | POA: Diagnosis present

## 2018-02-27 DIAGNOSIS — O26899 Other specified pregnancy related conditions, unspecified trimester: Secondary | ICD-10-CM

## 2018-02-27 DIAGNOSIS — O26893 Other specified pregnancy related conditions, third trimester: Secondary | ICD-10-CM | POA: Insufficient documentation

## 2018-02-27 DIAGNOSIS — Z3A31 31 weeks gestation of pregnancy: Secondary | ICD-10-CM | POA: Insufficient documentation

## 2018-02-27 LAB — URINALYSIS, ROUTINE W REFLEX MICROSCOPIC
BILIRUBIN URINE: NEGATIVE
Glucose, UA: NEGATIVE mg/dL
Hgb urine dipstick: NEGATIVE
KETONES UR: NEGATIVE mg/dL
Nitrite: NEGATIVE
PH: 6 (ref 5.0–8.0)
Protein, ur: NEGATIVE mg/dL
Specific Gravity, Urine: 1.008 (ref 1.005–1.030)

## 2018-02-27 LAB — WET PREP, GENITAL
Clue Cells Wet Prep HPF POC: NONE SEEN
Sperm: NONE SEEN
TRICH WET PREP: NONE SEEN
Yeast Wet Prep HPF POC: NONE SEEN

## 2018-02-27 LAB — FETAL FIBRONECTIN: FETAL FIBRONECTIN: NEGATIVE

## 2018-02-27 LAB — AMNISURE RUPTURE OF MEMBRANE (ROM) NOT AT ARMC: AMNISURE: NEGATIVE

## 2018-02-27 NOTE — MAU Provider Note (Signed)
History     CSN: [id number]  Arrival date and time: 02/27/18 4098   First Provider Initiated Contact with Patient 02/27/18 1108      Chief Complaint  Patient presents with  . Abdominal Pain  . Pelvic Pain  . Rupture of Membranes   HPI   Ms.Robin Lloyd is a 27 y.o. female G2P0100 @ 102w5d here in MAU with pressure, pain and leaking fluid. The pressure has been present for weeks, says she has not been able to be seen for this issue due to her work schedule. The leaking of fluid started last week. The leaking does not occur every day. She has a pad on however the leaking does not fill her pad. No recent intercourse. No bleeding. The pain in her belly is constant. She tried taking tylenol which does not help.The pain does not worsen with movement or position change.   OB History    Gravida  2   Para  1   Term      Preterm  1   AB      Living        SAB      TAB      Ectopic      Multiple      Live Births           Obstetric Comments  Premature rupture at 5 months.        Past Medical History:  Diagnosis Date  . Anxiety   . Chlamydia   . GERD (gastroesophageal reflux disease)   . Ovarian cyst   . Vaginal Pap smear, abnormal     Past Surgical History:  Procedure Laterality Date  . DILATION AND CURETTAGE OF UTERUS  2015   Rupture of membranes    History reviewed. No pertinent family history.  Social History   Tobacco Use  . Smoking status: Never Smoker  . Smokeless tobacco: Never Used  Substance Use Topics  . Alcohol use: No    Comment: occas.  . Drug use: No    Allergies: No Known Allergies  Medications Prior to Admission  Medication Sig Dispense Refill Last Dose  . doxycycline (VIBRAMYCIN) 100 MG capsule Take 1 capsule (100 mg total) by mouth 2 (two) times daily. One po bid x 7 days (Patient not taking: Reported on 10/01/2017) 14 capsule 0 Not Taking at Unknown time  . ibuprofen (ADVIL,MOTRIN) 200 MG tablet Take 400 mg by mouth daily  as needed for moderate pain.   Past Week at Unknown time  . metroNIDAZOLE (FLAGYL) 500 MG tablet Take 1 tablet (500 mg total) by mouth 2 (two) times daily. One po bid x 7 days (Patient not taking: Reported on 10/01/2017) 14 tablet 0 Not Taking at Unknown time  . Prenatal Vit-Fe Fumarate-FA (PRENATAL PO) Take 1 tablet by mouth daily.   10/01/2017 at Unknown time   Results for orders placed or performed during the hospital encounter of 02/27/18 (from the past 48 hour(s))  Urinalysis, Routine w reflex microscopic     Status: Abnormal   Collection Time: 02/27/18 10:16 AM  Result Value Ref Range   Color, Urine YELLOW YELLOW   APPearance CLEAR CLEAR   Specific Gravity, Urine 1.008 1.005 - 1.030   pH 6.0 5.0 - 8.0   Glucose, UA NEGATIVE NEGATIVE mg/dL   Hgb urine dipstick NEGATIVE NEGATIVE   Bilirubin Urine NEGATIVE NEGATIVE   Ketones, ur NEGATIVE NEGATIVE mg/dL   Protein, ur NEGATIVE NEGATIVE mg/dL   Nitrite NEGATIVE NEGATIVE  Leukocytes, UA TRACE (A) NEGATIVE   RBC / HPF 0-5 0 - 5 RBC/hpf   WBC, UA 0-5 0 - 5 WBC/hpf   Bacteria, UA RARE (A) NONE SEEN   Squamous Epithelial / LPF 0-5 0 - 5   Mucus PRESENT     Comment: Performed at [facility], [address], [place], [place] [address]  Wet prep, genital     Status: Abnormal   Collection Time: 02/27/18 11:25 AM  Result Value Ref Range   Yeast Wet Prep HPF POC NONE SEEN NONE SEEN   Trich, Wet Prep NONE SEEN NONE SEEN   Clue Cells Wet Prep HPF POC NONE SEEN NONE SEEN   WBC, Wet Prep HPF POC MANY (A) NONE SEEN    Comment: MANY BACTERIA SEEN   Sperm NONE SEEN     Comment: Performed at [facility], [address], [place], [place] [address]  Amnisure rupture of membrane (rom)not at [facility]     Status: None   Collection Time: 02/27/18 11:25 AM  Result Value Ref Range   Amnisure ROM NEGATIVE     Comment: Performed at [facility], [address], [place], [place] [address]  Fetal fibronectin     Status: None   Collection Time:  02/27/18 11:25 AM  Result Value Ref Range   Fetal Fibronectin NEGATIVE NEGATIVE    Comment: Performed at [facility], [address] [place], [place] [address]    Review of Systems  Constitutional: Negative for fever.  Gastrointestinal: Positive for abdominal pain. Negative for nausea and vomiting.  Genitourinary: Positive for pelvic pain and vaginal bleeding. Negative for vaginal discharge.   Physical Exam   Blood pressure 110/64, pulse 91, temperature 98.1 F (36.7 C), temperature source Oral, resp. rate 16, height 5\' 2"  (1.575 m), weight 69.9 kg, last menstrual period 07/21/2017, unknown if currently breastfeeding.  Physical Exam  Constitutional: She is oriented to person, place, and time. She appears well-developed and well-nourished. No distress.  HENT:  Head: Normocephalic.  Eyes: Pupils are equal, round, and reactive to light.  Neck: Neck supple.  GI: Soft. She exhibits no distension. There is no tenderness. There is no rebound and no guarding.  Genitourinary:  Genitourinary Comments: Vagina - Small amount of white vaginal discharge, no odor, no pooling of fluid  Cervix - No contact bleeding, no active bleeding  Bimanual exam: Cervix closed, thick posterior  wet prep done Chaperone present for exam.         Musculoskeletal: Normal range of motion.  Neurological: She is alert and oriented to person, place, and time.  Skin: Skin is warm. She is not diaphoretic.  Psychiatric: Her behavior is normal.   Fetal Tracing: Baseline: 130 bpm Variability: Moderate  Accelerations: 15x15 Decelerations: None Toco: Occasional   MAU Course  Procedures  None  MDM  Urine culture pending  Fern negative  Amnisure negative Wet prep and UA collected  Discussed patient with Dr. Estanislado Pandy, discussed HPI, labs and cultures. Ok for DC home.    Assessment and Plan   A:  1. Pelvic pressure in pregnancy   2. [redacted] weeks gestation of pregnancy   3. Vaginal discharge during  pregnancy in third trimester     P:  Discharge home in stable condition Pregnancy support belt recommended Follow up in the office  Return to MAU if symptoms worsen  Beatryce Colombo, Harolyn Rutherford, NP 02/27/2018 5:24 PM

## 2018-02-27 NOTE — MAU Note (Signed)
Pt C/O lower abd & pelvic pain for the last 2 days, denies bleeding, thinks she may be leaking fluid.  Leaks after urinating, also had wetness this morning before going to the bathroom.  Reports good fetal movement.

## 2018-02-27 NOTE — Discharge Instructions (Signed)
Abdominal Pain During Pregnancy Abdominal pain is common in pregnancy. Most of the time, it does not cause harm. There are many causes of abdominal pain. Some causes are more serious than others and sometimes the cause is not known. Abdominal pain can be a sign that something is very wrong with the pregnancy or the pain may have nothing to do with the pregnancy. Always tell your health care provider if you have any abdominal pain. Follow these instructions at home:  Do not have sex or put anything in your vagina until your symptoms go away completely.  Watch your abdominal pain for any changes.  Get plenty of rest until your pain improves.  Drink enough fluid to keep your urine clear or pale yellow.  Take over-the-counter or prescription medicines only as told by your health care provider.  Keep all follow-up visits as told by your health care provider. This is important. Contact a health care provider if:  You have a fever.  Your pain gets worse or you have cramping.  Your pain continues after resting. Get help right away if:  You are bleeding, leaking fluid, or passing tissue from the vagina.  You have vomiting or diarrhea that does not go away.  You have painful or bloody urination.  You notice a decrease in your baby's movements.  You feel very weak or faint.  You have shortness of breath.  You develop a severe headache with abdominal pain.  You have abnormal vaginal discharge with abdominal pain. This information is not intended to replace advice given to you by your health care provider. Make sure you discuss any questions you have with your health care provider. Document Released: 05/21/2005 Document Revised: 03/01/2016 Document Reviewed: 12/18/2012 Elsevier Interactive Patient Education  2018 [employer] Inc[employer]   Round Ligament Pain During Pregnancy   Round ligament pain is a sharp pain or jabbing feeling often felt in the lower belly or groin area on one or both  sides. It is one of the most common complaints during pregnancy and is considered a normal part of pregnancy. It is most often felt during the second trimester.   Here is what you need to know about round ligament pain, including some tips to help you feel better.   Causes of Round Ligament Pain:    Several thick ligaments surround and support your womb (uterus) as it grows during pregnancy. One of them is called the round ligament.   The round ligament connects the front part of the womb to your groin, the area where your legs attach to your pelvis. The round ligament normally tightens and relaxes slowly.   As your baby and womb grow, the round ligament stretches. That makes it more likely to become strained.   Sudden movements can cause the ligament to tighten quickly, like a rubber band snapping. This causes a sudden and quick jabbing feeling.   Symptoms of Round Ligament Pain   Round ligament pain can be concerning and uncomfortable. But it is considered normal as your body changes during pregnancy.   The symptoms of round ligament pain include a sharp, sudden spasm in the belly. It usually affects the right side, but it may happen on both sides. The pain only lasts a few seconds.   Exercise may cause the pain, as will rapid movements such as:   sneezing  coughing  laughing  rolling over in bed  standing up too quickly   Treatment of Round Ligament Pain   Here are some  may help reduce your discomfort:  ° °Pain relief. Take over-the-counter acetaminophen for pain, if necessary. Ask your doctor if this is OK.  ° °Exercise. Get plenty of exercise to keep your stomach (core) muscles strong. Doing stretching exercises or prenatal yoga can be helpful. Ask your doctor which exercises are safe for you and your baby.  ° °A helpful exercise involves putting your hands and knees on the floor, lowering your head, and pushing your backside into the air.  ° °Avoid sudden movements. Change  positions slowly (such as standing up or sitting down) to avoid sudden movements that may cause stretching and pain.  ° °Flex your hips. Bend and flex your hips before you cough, sneeze, or laugh to avoid pulling on the ligaments.  ° °Apply warmth. A heating pad or warm bath may be helpful. Ask your doctor if this is OK. Extreme heat can be dangerous to the baby.  ° °You should try to modify your daily activity level and avoid positions that may worsen the condition.  ° °When to Call the Doctor/Midwife  ° °Always tell your doctor or midwife about any type of pain you have during pregnancy. Round ligament pain is quick and doesn't last long.  ° °Call your health care provider immediately if you have:  °severe pain  °fever  °chills  °pain on urination  °difficulty walking  ° °Belly pain during pregnancy can be due to many different causes. It is important for your doctor to rule out more serious conditions, including pregnancy complications such as placenta abruption or non-pregnancy illnesses such as:  °inguinal hernia  °appendicitis  °stomach, liver, and kidney problems  °Preterm labor pains may sometimes be mistaken for round ligament pain.   ° °

## 2018-02-28 LAB — CULTURE, OB URINE
Culture: NO GROWTH
Special Requests: NORMAL

## 2018-04-08 ENCOUNTER — Other Ambulatory Visit: Payer: Medicaid Other

## 2018-04-08 ENCOUNTER — Encounter: Payer: Medicaid Other | Admitting: Genetics

## 2018-04-10 DIAGNOSIS — Z22330 Carrier of Group B streptococcus: Secondary | ICD-10-CM | POA: Insufficient documentation

## 2018-04-23 ENCOUNTER — Inpatient Hospital Stay (EMERGENCY_DEPARTMENT_HOSPITAL)
Admission: AD | Admit: 2018-04-23 | Discharge: 2018-04-24 | Disposition: A | Discharge status: Home / Self Care | Payer: Medicaid Other | Source: Ambulatory Visit | Attending: Obstetrics and Gynecology | Admitting: Obstetrics and Gynecology

## 2018-04-23 DIAGNOSIS — Z3A39 39 weeks gestation of pregnancy: Secondary | ICD-10-CM

## 2018-04-23 DIAGNOSIS — N898 Other specified noninflammatory disorders of vagina: Secondary | ICD-10-CM | POA: Insufficient documentation

## 2018-04-23 DIAGNOSIS — O26893 Other specified pregnancy related conditions, third trimester: Secondary | ICD-10-CM | POA: Insufficient documentation

## 2018-04-23 DIAGNOSIS — O479 False labor, unspecified: Secondary | ICD-10-CM

## 2018-04-23 LAB — POCT FERN TEST: POCT Fern Test: NEGATIVE

## 2018-04-23 NOTE — MAU Note (Signed)
CTX since 2000, now 4 minutes apart.  No LOF/VB.  +FM.  No complications with the pregnancy.  No VE in the office.

## 2018-04-23 NOTE — MAU Provider Note (Signed)
Chief Complaint:  Contractions   First Provider Initiated Contact with Patient 04/23/18 2256      HPI: Robin Lloyd is a 27 y.o. G2P0100 at [id number] presents to maternity admissions reporting passage of some kind of fluid with contractions,  Not sure when.   She reports good fetal movement, denies vaginal bleeding, vaginal itching/burning, urinary symptoms, h/a, dizziness, n/v, or fever/chills.    Vaginal Discharge  The patient's primary symptoms include pelvic pain, vaginal bleeding and vaginal discharge. The patient's pertinent negatives include no genital itching, genital lesions or genital odor. This is a new problem. The current episode started today. The problem occurs intermittently. The problem has been unchanged. The pain is moderate. The problem affects both sides. She is pregnant. Associated symptoms include abdominal pain. Pertinent negatives include no constipation, diarrhea, dysuria, fever, nausea or vomiting. The vaginal discharge was bloody and brown. She has not been passing clots. She has not been passing tissue. Nothing aggravates the symptoms. She has tried nothing for the symptoms.    Past Medical History: Past Medical History:  Diagnosis Date  . Anxiety   . Chlamydia   . GERD (gastroesophageal reflux disease)   . Ovarian cyst   . Vaginal Pap smear, abnormal     Past obstetric history: OB History  Gravida Para Term Preterm AB Living  2 1   1       SAB TAB Ectopic Multiple Live Births               # Outcome Date GA Lbr Len/2nd Weight Sex Delivery Anes PTL Lv  2 Current           1 Preterm         FD    Obstetric Comments  Premature rupture at 5 months.    Past Surgical History: Past Surgical History:  Procedure Laterality Date  . DILATION AND CURETTAGE OF UTERUS  2015   Rupture of membranes    Family History: No family history on file.  Social History: Social History   Tobacco Use  . Smoking status: Never Smoker  . Smokeless tobacco: Never  Used  Substance Use Topics  . Alcohol use: No    Comment: occas.  . Drug use: No    Allergies: No Known Allergies  Meds:  Medications Prior to Admission  Medication Sig Dispense Refill Last Dose  . Prenatal Vit-Fe Fumarate-FA (PRENATAL PO) Take 1 tablet by mouth daily.   10/01/2017 at Unknown time    ROS:  Review of Systems  Constitutional: Negative for fever.  Gastrointestinal: Positive for abdominal pain. Negative for constipation, diarrhea, nausea and vomiting.  Genitourinary: Positive for pelvic pain and vaginal discharge. Negative for dysuria.   I have reviewed patient's Past Medical Hx, Surgical Hx, Family Hx, Social Hx, medications and allergies.   Physical Exam   Patient Vitals for the past 24 hrs:  BP Temp Pulse Resp SpO2 Height Weight  04/23/18 2211 124/82 98.1 F (36.7 C) 97 19 100 % - -  04/23/18 2210 - - - - - 5\' 2"  (1.575 m) 75.4 kg   Constitutional: Well-developed, well-nourished female in no acute distress.  Cardiovascular: normal rate and rhythm Respiratory: normal effort, no distress GI: Abd soft, non-tender, gravid appropriate for gestational age.  MS: Extremities nontender, no edema, normal ROM Neurologic: Alert and oriented x 4.  GU: Neg CVAT.  PELVIC EXAM: Cervix pink, visually closed, without lesion, moderate amount light brown discharge, vaginal walls and external genitalia normal    No  pooling, no ferning  Dilation: 2.5 Effacement (%): 70 Cervical Position: Anterior Station: -3, -2 Presentation: Vertex Exam by:: Latricia Heft, RN  FHT:  Baseline 140 , moderate variability, accelerations present, no decelerations Contractions: q 3-5 mins   Labs: No results found for this or any previous visit (from the past 24 hour(s)).    Imaging:  No results found.  MAU Course/MDM: I have Done a speculum exam and fern test which was Negative.  Pooling was negative Nitrazine not done.    Pt stable at time of discharge.  Assessment: Single  intrauterine pregnancy at [redacted]w[redacted]d Vaginal discharge, likely bloody show  Plan: Discharge home Offered to watch her one more hour since she was having a lot of pain.  She wants to go home Labor precautions and fetal kick counts  Encouraged to return here or to other Urgent Care/ED if she develops worsening of symptoms, increase in pain, fever, or other concerning symptoms.    Wynelle Bourgeois CNM, MSN Certified Nurse-Midwife 04/23/2018 10:56 PM

## 2018-04-24 ENCOUNTER — Inpatient Hospital Stay (HOSPITAL_COMMUNITY): Payer: Medicaid Other | Admitting: Anesthesiology

## 2018-04-24 ENCOUNTER — Inpatient Hospital Stay (HOSPITAL_COMMUNITY)
Admission: AD | Admit: 2018-04-24 | Discharge: 2018-04-26 | DRG: 807 | Disposition: A | Discharge status: Home / Self Care | Payer: Medicaid Other | Attending: Obstetrics and Gynecology | Admitting: Obstetrics and Gynecology

## 2018-04-24 ENCOUNTER — Encounter (HOSPITAL_COMMUNITY): Payer: Self-pay | Admitting: Advanced Practice Midwife

## 2018-04-24 DIAGNOSIS — N898 Other specified noninflammatory disorders of vagina: Secondary | ICD-10-CM

## 2018-04-24 DIAGNOSIS — O99824 Streptococcus B carrier state complicating childbirth: Secondary | ICD-10-CM | POA: Diagnosis present

## 2018-04-24 DIAGNOSIS — Z3A39 39 weeks gestation of pregnancy: Secondary | ICD-10-CM

## 2018-04-24 DIAGNOSIS — O26893 Other specified pregnancy related conditions, third trimester: Secondary | ICD-10-CM

## 2018-04-24 DIAGNOSIS — Z3483 Encounter for supervision of other normal pregnancy, third trimester: Secondary | ICD-10-CM | POA: Diagnosis present

## 2018-04-24 DIAGNOSIS — A749 Chlamydial infection, unspecified: Secondary | ICD-10-CM | POA: Diagnosis present

## 2018-04-24 LAB — CBC
HCT: 32 % — ABNORMAL LOW (ref 36.0–46.0)
Hemoglobin: 10 g/dL — ABNORMAL LOW (ref 12.0–15.0)
MCH: 26.9 pg (ref 26.0–34.0)
MCHC: 31.3 g/dL (ref 30.0–36.0)
MCV: 86 fL (ref 80.0–100.0)
Platelets: 173 10*3/uL (ref 150–400)
RBC: 3.72 MIL/uL — ABNORMAL LOW (ref 3.87–5.11)
RDW: 16.4 % — ABNORMAL HIGH (ref 11.5–15.5)
WBC: 5.9 10*3/uL (ref 4.0–10.5)
nRBC: 0 % (ref 0.0–0.2)

## 2018-04-24 LAB — OB RESULTS CONSOLE GBS: GBS: POSITIVE

## 2018-04-24 LAB — TYPE AND SCREEN
ABO/RH(D): B POS
Antibody Screen: NEGATIVE

## 2018-04-24 MED ORDER — DIPHENHYDRAMINE HCL 50 MG/ML IJ SOLN: 12.5000 mg | INTRAMUSCULAR | Status: DC | PRN

## 2018-04-24 MED ORDER — ACETAMINOPHEN 325 MG PO TABS: 650.0000 mg | ORAL_TABLET | ORAL | Status: DC | PRN

## 2018-04-24 MED ORDER — PRENATAL MULTIVITAMIN CH
1.0000 | ORAL_TABLET | Freq: Every day | ORAL | Status: DC
  Administered 2018-04-25 – 2018-04-26 (×2): 1 via ORAL
  Filled 2018-04-24 (×2): qty 1

## 2018-04-24 MED ORDER — MEDROXYPROGESTERONE ACETATE 150 MG/ML IM SUSP
150.0000 mg | INTRAMUSCULAR | Status: AC | PRN
  Administered 2018-04-26: 150 mg via INTRAMUSCULAR

## 2018-04-24 MED ORDER — OXYTOCIN BOLUS FROM INFUSION
500.0000 mL | Freq: Once | INTRAVENOUS | Status: AC
  Administered 2018-04-24: 500 mL via INTRAVENOUS

## 2018-04-24 MED ORDER — PENICILLIN G 3 MILLION UNITS IVPB - SIMPLE MED
3.0000 10*6.[IU] | INTRAVENOUS | Status: DC
  Administered 2018-04-24: 3 10*6.[IU] via INTRAVENOUS
  Filled 2018-04-24: qty 100

## 2018-04-24 MED ORDER — EPHEDRINE 5 MG/ML INJ: 10.0000 mg | INTRAVENOUS | Status: DC | PRN

## 2018-04-24 MED ORDER — BUTORPHANOL TARTRATE 1 MG/ML IJ SOLN
1.0000 mg | Freq: Once | INTRAMUSCULAR | Status: AC
  Administered 2018-04-24: 1 mg via INTRAMUSCULAR
  Filled 2018-04-24: qty 1

## 2018-04-24 MED ORDER — LACTATED RINGERS IV SOLN: 500.0000 mL | INTRAVENOUS | Status: DC | PRN

## 2018-04-24 MED ORDER — ZOLPIDEM TARTRATE 5 MG PO TABS: 5.0000 mg | ORAL_TABLET | Freq: Every evening | ORAL | Status: DC | PRN

## 2018-04-24 MED ORDER — ONDANSETRON HCL 4 MG/2ML IJ SOLN: 4.0000 mg | INTRAMUSCULAR | Status: DC | PRN

## 2018-04-24 MED ORDER — COCONUT OIL OIL
1.0000 "application " | TOPICAL_OIL | Status: DC | PRN
  Filled 2018-04-24: qty 120

## 2018-04-24 MED ORDER — ONDANSETRON HCL 4 MG PO TABS: 4.0000 mg | ORAL_TABLET | ORAL | Status: DC | PRN

## 2018-04-24 MED ORDER — LACTATED RINGERS IV SOLN
500.0000 mL | Freq: Once | INTRAVENOUS | Status: AC
  Administered 2018-04-24: 500 mL via INTRAVENOUS

## 2018-04-24 MED ORDER — ACETAMINOPHEN 325 MG PO TABS
650.0000 mg | ORAL_TABLET | ORAL | Status: DC | PRN
  Administered 2018-04-24 – 2018-04-26 (×2): 650 mg via ORAL
  Filled 2018-04-24 (×2): qty 2

## 2018-04-24 MED ORDER — DIPHENHYDRAMINE HCL 25 MG PO CAPS: 25.0000 mg | ORAL_CAPSULE | Freq: Four times a day (QID) | ORAL | Status: DC | PRN

## 2018-04-24 MED ORDER — MEASLES, MUMPS & RUBELLA VAC IJ SOLR
0.5000 mL | Freq: Once | INTRAMUSCULAR | Status: DC
  Filled 2018-04-24: qty 0.5

## 2018-04-24 MED ORDER — OXYCODONE-ACETAMINOPHEN 5-325 MG PO TABS: 1.0000 | ORAL_TABLET | ORAL | Status: DC | PRN

## 2018-04-24 MED ORDER — DIBUCAINE 1 % RE OINT
1.0000 "application " | TOPICAL_OINTMENT | RECTAL | Status: DC | PRN
  Administered 2018-04-26: 1 via RECTAL
  Filled 2018-04-24 (×2): qty 28

## 2018-04-24 MED ORDER — LIDOCAINE HCL (PF) 1 % IJ SOLN
30.0000 mL | INTRAMUSCULAR | Status: DC | PRN
  Filled 2018-04-24: qty 30

## 2018-04-24 MED ORDER — PHENYLEPHRINE 40 MCG/ML (10ML) SYRINGE FOR IV PUSH (FOR BLOOD PRESSURE SUPPORT): 80.0000 ug | PREFILLED_SYRINGE | INTRAVENOUS | Status: DC | PRN

## 2018-04-24 MED ORDER — OXYCODONE-ACETAMINOPHEN 5-325 MG PO TABS: 2.0000 | ORAL_TABLET | ORAL | Status: DC | PRN

## 2018-04-24 MED ORDER — SENNOSIDES-DOCUSATE SODIUM 8.6-50 MG PO TABS
2.0000 | ORAL_TABLET | ORAL | Status: DC
  Administered 2018-04-24 – 2018-04-26 (×2): 2 via ORAL
  Filled 2018-04-24 (×2): qty 2

## 2018-04-24 MED ORDER — PROMETHAZINE HCL 25 MG/ML IJ SOLN
25.0000 mg | Freq: Once | INTRAMUSCULAR | Status: AC
  Administered 2018-04-24: 25 mg via INTRAMUSCULAR
  Filled 2018-04-24: qty 1

## 2018-04-24 MED ORDER — FLEET ENEMA 7-19 GM/118ML RE ENEM: 1.0000 | ENEMA | RECTAL | Status: DC | PRN

## 2018-04-24 MED ORDER — OXYTOCIN 40 UNITS IN LACTATED RINGERS INFUSION - SIMPLE MED
2.5000 [IU]/h | INTRAVENOUS | Status: DC
  Filled 2018-04-24: qty 1000

## 2018-04-24 MED ORDER — SOD CITRATE-CITRIC ACID 500-334 MG/5ML PO SOLN: 30.0000 mL | ORAL | Status: DC | PRN

## 2018-04-24 MED ORDER — LACTATED RINGERS IV SOLN
INTRAVENOUS | Status: DC
  Administered 2018-04-24 (×3): via INTRAVENOUS

## 2018-04-24 MED ORDER — SIMETHICONE 80 MG PO CHEW: 80.0000 mg | CHEWABLE_TABLET | ORAL | Status: DC | PRN

## 2018-04-24 MED ORDER — ONDANSETRON HCL 4 MG/2ML IJ SOLN: 4.0000 mg | Freq: Four times a day (QID) | INTRAMUSCULAR | Status: DC | PRN

## 2018-04-24 MED ORDER — PHENYLEPHRINE 40 MCG/ML (10ML) SYRINGE FOR IV PUSH (FOR BLOOD PRESSURE SUPPORT)
80.0000 ug | PREFILLED_SYRINGE | INTRAVENOUS | Status: DC | PRN
  Filled 2018-04-24: qty 10

## 2018-04-24 MED ORDER — IBUPROFEN 600 MG PO TABS
600.0000 mg | ORAL_TABLET | Freq: Four times a day (QID) | ORAL | Status: DC
  Administered 2018-04-24 – 2018-04-26 (×7): 600 mg via ORAL
  Filled 2018-04-24 (×7): qty 1

## 2018-04-24 MED ORDER — FENTANYL 2.5 MCG/ML BUPIVACAINE 1/10 % EPIDURAL INFUSION (WH - ANES)
14.0000 mL/h | INTRAMUSCULAR | Status: DC | PRN
  Administered 2018-04-24: 14 mL/h via EPIDURAL
  Filled 2018-04-24: qty 100

## 2018-04-24 MED ORDER — BENZOCAINE-MENTHOL 20-0.5 % EX AERO
1.0000 "application " | INHALATION_SPRAY | CUTANEOUS | Status: DC | PRN
  Administered 2018-04-24: 1 via TOPICAL
  Filled 2018-04-24 (×2): qty 56

## 2018-04-24 MED ORDER — SODIUM CHLORIDE 0.9 % IV SOLN
5.0000 10*6.[IU] | Freq: Once | INTRAVENOUS | Status: AC
  Administered 2018-04-24: 5 10*6.[IU] via INTRAVENOUS
  Filled 2018-04-24: qty 5

## 2018-04-24 MED ORDER — LIDOCAINE-EPINEPHRINE (PF) 2 %-1:200000 IJ SOLN
INTRAMUSCULAR | Status: DC | PRN
  Administered 2018-04-24: 3 mL via EPIDURAL
  Administered 2018-04-24: 4 mL via EPIDURAL

## 2018-04-24 MED ORDER — WITCH HAZEL-GLYCERIN EX PADS
1.0000 "application " | MEDICATED_PAD | CUTANEOUS | Status: DC | PRN
  Administered 2018-04-26: 1 via TOPICAL

## 2018-04-24 NOTE — MAU Note (Signed)
I have communicated with Wynelle BourgeoisMarie Williams, CNM and reviewed vital signs:  Vitals:   04/24/18 0026 04/24/18 0033  BP: 117/80   Pulse: 78   Resp:  19  Temp:    SpO2:      Vaginal exam:  Dilation: 2.5 Effacement (%): 40 Cervical Position: Middle Station: -3 Presentation: Vertex Exam by:: Latricia HeftAnna Jaquel Glassburn, RN,   Also reviewed contraction pattern and that non-stress test is reactive.  It has been documented that patient is contracting every 4-7 minutes with no cervical change over 1 hour not indicating active labor.  Patient denies any other complaints.  Based on this report provider has given order for discharge.  A discharge order and diagnosis entered by a provider.   Labor discharge instructions reviewed with patient.

## 2018-04-24 NOTE — MAU Note (Signed)
Went home, took a bath and contractions got more intense.  No LOF/VB.  +FM.

## 2018-04-24 NOTE — Anesthesia Procedure Notes (Addendum)
Epidural Patient location during procedure: OB Start time: 04/24/2018 10:40 AM End time: 04/24/2018 10:55 AM  Staffing Anesthesiologist: Elmer PickerWoodrum, Quantavia Frith L, MD Performed: anesthesiologist   Preanesthetic Checklist Completed: patient identified, pre-op evaluation, timeout performed, IV checked, risks and benefits discussed and monitors and equipment checked  Epidural Patient position: sitting Prep: site prepped and draped and DuraPrep Patient monitoring: continuous pulse ox, blood pressure, heart rate and cardiac monitor Approach: midline Location: L3-L4 Injection technique: LOR air  Needle:  Needle type: Tuohy  Needle gauge: 17 G Needle length: 9 cm Needle insertion depth: 4 cm Catheter type: closed end flexible Catheter size: 19 Gauge Catheter at skin depth: 9 cm Test dose: negative  Assessment Sensory level: T8 Events: blood not aspirated, injection not painful, no injection resistance, negative IV test and no paresthesia  Additional Notes Patient identified. Risks/Benefits/Options discussed with patient including but not limited to bleeding, infection, nerve damage, paralysis, failed block, incomplete pain control, headache, blood pressure changes, nausea, vomiting, reactions to medication both or allergic, itching and postpartum back pain. Confirmed with bedside nurse the patient's most recent platelet count. Confirmed with patient that they are not currently taking any anticoagulation, have any bleeding history or any family history of bleeding disorders. Patient expressed understanding and wished to proceed. All questions were answered. Sterile technique was used throughout the entire procedure. Please see nursing notes for vital signs. Test dose was given through epidural catheter and negative prior to continuing to dose epidural or start infusion. Warning signs of high block given to the patient including shortness of breath, tingling/numbness in hands, complete motor  block, or any concerning symptoms with instructions to call for help. Patient was given instructions on fall risk and not to get out of bed. All questions and concerns addressed with instructions to call with any issues or inadequate analgesia.  Reason for block:procedure for pain

## 2018-04-24 NOTE — Anesthesia Preprocedure Evaluation (Signed)
Anesthesia Evaluation  Patient identified by MRN, date of birth, ID band Patient awake    Reviewed: Allergy & Precautions, NPO status , Patient's Chart, lab work & pertinent test results  Airway Mallampati: I  TM Distance: >3 FB Neck ROM: Full    Dental no notable dental hx.    Pulmonary neg pulmonary ROS,    Pulmonary exam normal breath sounds clear to auscultation       Cardiovascular negative cardio ROS Normal cardiovascular exam Rhythm:Regular Rate:Normal     Neuro/Psych PSYCHIATRIC DISORDERS Anxiety negative neurological ROS     GI/Hepatic Neg liver ROS, GERD  ,  Endo/Other  negative endocrine ROS  Renal/GU negative Renal ROS  negative genitourinary   Musculoskeletal negative musculoskeletal ROS (+)   Abdominal   Peds  Hematology negative hematology ROS (+)   Anesthesia Other Findings   Reproductive/Obstetrics (+) Pregnancy                             Anesthesia Physical Anesthesia Plan  ASA: II  Anesthesia Plan: Epidural   Post-op Pain Management:    Induction:   PONV Risk Score and Plan: Treatment may vary due to age or medical condition  Airway Management Planned: Natural Airway  Additional Equipment:   Intra-op Plan:   Post-operative Plan:   Informed Consent: I have reviewed the patients History and Physical, chart, labs and discussed the procedure including the risks, benefits and alternatives for the proposed anesthesia with the patient or authorized representative who has indicated his/her understanding and acceptance.     Plan Discussed with: Anesthesiologist  Anesthesia Plan Comments: (Patient identified. Risks, benefits, options discussed with patient including but not limited to bleeding, infection, nerve damage, paralysis, failed block, incomplete pain control, headache, blood pressure changes, nausea, vomiting, reactions to medication, itching, and post  partum back pain. Confirmed with bedside nurse the patient's most recent platelet count. Confirmed with the patient that they are not taking any anticoagulation, have any bleeding history or any family history of bleeding disorders. Patient expressed understanding and wishes to proceed. All questions were answered. )        Anesthesia Quick Evaluation

## 2018-04-24 NOTE — H&P (Signed)
Robin Lloyd is a 27 y.o. female presenting for labor.  Pt had contractions q5-7 minutes.  She was sent home and came back bc of the pain.  She had morphine rest and . OB History    Gravida  2   Para  0   Term      Preterm  0   AB  1   Living        SAB      TAB      Ectopic      Multiple      Live Births  0        Obstetric Comments  Premature rupture at 5 months.       Past Medical History:  Diagnosis Date  . Anxiety   . Chlamydia   . GERD (gastroesophageal reflux disease)   . Ovarian cyst   . Vaginal Pap smear, abnormal    Past Surgical History:  Procedure Laterality Date  . DILATION AND CURETTAGE OF UTERUS  2015   Rupture of membranes   Family History: family history is not on file. Social History:  reports that she has never smoked. She has never used smokeless tobacco. She reports that she does not drink alcohol or use drugs.     Maternal Diabetes: No Genetic Screening: Normal Maternal Ultrasounds/Referrals: Normal Fetal Ultrasounds or other Referrals:  None Maternal Substance Abuse:  No Significant Maternal Medications:  None Significant Maternal Lab Results:  None Other Comments:  None  ROS History  Physical Examination: General appearance - alert, well appearing, and in no distress Heart - normal rate and regular rhythm Abdomen - soft, nontender, nondistended, no masses or organomegaly gravid Extremities - Homan's sign negative bilaterally Dilation: 8 Effacement (%): 100 Station: 0 Exam by:: [place], rn Blood pressure 112/68, pulse 93, temperature 98.2 F (36.8 C), temperature source Oral, resp. rate 16, last menstrual period 07/21/2017, SpO2 100 %, unknown if currently breastfeeding. Exam Physical Exam  Prenatal labs: ABO, Rh: --/--/B POS (11/21 [id number]) Antibody: NEG (11/21 0925) Rubella: Immune (05/20 0000) RPR: Nonreactive (05/20 0000)  HBsAg: Negative (05/20 0000)  HIV: Non-reactive (05/20 0000)  GBS: Positive (11/21 0000)    Assessment/Plan: Labor at Term GBS positive.  PCNG started AROM clear fluid Anticipate SVD   Aj Crunkleton A 04/24/2018, 2:24 PM

## 2018-04-24 NOTE — Discharge Instructions (Signed)
Vaginal Delivery Vaginal delivery means that you will give birth by pushing your baby out of your birth canal (vagina). A team of health care providers will help you before, during, and after vaginal delivery. Birth experiences are unique for every woman and every pregnancy, and birth experiences vary depending on where you choose to give birth. What should I do to prepare for my baby's birth? Before your baby is born, it is important to talk with your health care provider about:  Your labor and delivery preferences. These may include: ? Medicines that you may be given. ? How you will manage your pain. This might include non-medical pain relief techniques or injectable pain relief such as epidural analgesia. ? How you and your baby will be monitored during labor and delivery. ? Who may be in the labor and delivery room with you. ? Your feelings about surgical delivery of your baby (cesarean delivery, or C-section) if this becomes necessary. ? Your feelings about receiving donated blood through an IV tube (blood transfusion) if this becomes necessary.  Whether you are able: ? To take pictures or videos of the birth. ? To eat during labor and delivery. ? To move around, walk, or change positions during labor and delivery.  What to expect after your baby is born, such as: ? Whether delayed umbilical cord clamping and cutting is offered. ? Who will care for your baby right after birth. ? Medicines or tests that may be recommended for your baby. ? Whether breastfeeding is supported in your hospital or birth center. ? How long you will be in the hospital or birth center.  How any medical conditions you have may affect your baby or your labor and delivery experience.  To prepare for your baby's birth, you should also:  Attend all of your health care visits before delivery (prenatal visits) as recommended by your health care provider. This is important.  Prepare your home for your baby's  arrival. Make sure that you have: ? Diapers. ? Baby clothing. ? Feeding equipment. ? Safe sleeping arrangements for you and your baby.  Install a car seat in your vehicle. Have your car seat checked by a certified car seat installer to make sure that it is installed safely.  Think about who will help you with your new baby at home for at least the first several weeks after delivery.  What can I expect when I arrive at the birth center or hospital? Once you are in labor and have been admitted into the hospital or birth center, your health care provider may:  Review your pregnancy history and any concerns you have.  Insert an IV tube into one of your veins. This is used to give you fluids and medicines.  Check your blood pressure, pulse, temperature, and heart rate (vital signs).  Check whether your bag of water (amniotic sac) has broken (ruptured).  Talk with you about your birth plan and discuss pain control options.  Monitoring Your health care provider may monitor your contractions (uterine monitoring) and your baby's heart rate (fetal monitoring). You may need to be monitored:  Often, but not continuously (intermittently).  All the time or for long periods at a time (continuously). Continuous monitoring may be needed if: ? You are taking certain medicines, such as medicine to relieve pain or make your contractions stronger. ? You have pregnancy or labor complications.  Monitoring may be done by:  Placing a special stethoscope or a handheld monitoring device on your abdomen to   check your baby's heartbeat, and feeling your abdomen for contractions. This method of monitoring does not continuously record your baby's heartbeat or your contractions.  Placing monitors on your abdomen (external monitors) to record your baby's heartbeat and the frequency and length of contractions. You may not have to wear external monitors all the time.  Placing monitors inside of your uterus  (internal monitors) to record your baby's heartbeat and the frequency, length, and strength of your contractions. ? Your health care provider may use internal monitors if he or she needs more information about the strength of your contractions or your baby's heart rate. ? Internal monitors are put in place by passing a thin, flexible wire through your vagina and into your uterus. Depending on the type of monitor, it may remain in your uterus or on your baby's head until birth. ? Your health care provider will discuss the benefits and risks of internal monitoring with you and will ask for your permission before inserting the monitors.  Telemetry. This is a type of continuous monitoring that can be done with external or internal monitors. Instead of having to stay in bed, you are able to move around during telemetry. Ask your health care provider if telemetry is an option for you.  Physical exam Your health care provider may perform a physical exam. This may include:  Checking whether your baby is positioned: ? With the head toward your vagina (head-down). This is most common. ? With the head toward the top of your uterus (head-up or breech). If your baby is in a breech position, your health care provider may try to turn your baby to a head-down position so you can deliver vaginally. If it does not seem that your baby can be born vaginally, your provider may recommend surgery to deliver your baby. In rare cases, you may be able to deliver vaginally if your baby is head-up (breech delivery). ? Lying sideways (transverse). Babies that are lying sideways cannot be delivered vaginally.  Checking your cervix to determine: ? Whether it is thinning out (effacing). ? Whether it is opening up (dilating). ? How low your baby has moved into your birth canal.  What are the three stages of labor and delivery?  Normal labor and delivery is divided into the following three stages: Stage 1  Stage 1 is the  longest stage of labor, and it can last for hours or days. Stage 1 includes: ? Early labor. This is when contractions may be irregular, or regular and mild. Generally, early labor contractions are more than 10 minutes apart. ? Active labor. This is when contractions get longer, more regular, more frequent, and more intense. ? The transition phase. This is when contractions happen very close together, are very intense, and may last longer than during any other part of labor.  Contractions generally feel mild, infrequent, and irregular at first. They get stronger, more frequent (about every 2-3 minutes), and more regular as you progress from early labor through active labor and transition.  Many women progress through stage 1 naturally, but you may need help to continue making progress. If this happens, your health care provider may talk with you about: ? Rupturing your amniotic sac if it has not ruptured yet. ? Giving you medicine to help make your contractions stronger and more frequent.  Stage 1 ends when your cervix is completely dilated to 4 inches (10 cm) and completely effaced. This happens at the end of the transition phase. Stage 2  Once   your cervix is completely effaced and dilated to 4 inches (10 cm), you may start to feel an urge to push. It is common for the body to naturally take a rest before feeling the urge to push, especially if you received an epidural or certain other pain medicines. This rest period may last for up to 1-2 hours, depending on your unique labor experience.  During stage 2, contractions are generally less painful, because pushing helps relieve contraction pain. Instead of contraction pain, you may feel stretching and burning pain, especially when the widest part of your baby's head passes through the vaginal opening (crowning).  Your health care provider will closely monitor your pushing progress and your baby's progress through the vagina during stage 2.  Your  health care provider may massage the area of skin between your vaginal opening and anus (perineum) or apply warm compresses to your perineum. This helps it stretch as the baby's head starts to crown, which can help prevent perineal tearing. ? In some cases, an incision may be made in your perineum (episiotomy) to allow the baby to pass through the vaginal opening. An episiotomy helps to make the opening of the vagina larger to allow more room for the baby to fit through.  It is very important to breathe and focus so your health care provider can control the delivery of your baby's head. Your health care provider may have you decrease the intensity of your pushing, to help prevent perineal tearing.  After delivery of your baby's head, the shoulders and the rest of the body generally deliver very quickly and without difficulty.  Once your baby is delivered, the umbilical cord may be cut right away, or this may be delayed for 1-2 minutes, depending on your baby's health. This may vary among health care providers, hospitals, and birth centers.  If you and your baby are healthy enough, your baby may be placed on your chest or abdomen to help maintain the baby's temperature and to help you bond with each other. Some mothers and babies start breastfeeding at this time. Your health care team will dry your baby and help keep your baby warm during this time.  Your baby may need immediate care if he or she: ? Showed signs of distress during labor. ? Has a medical condition. ? Was born too early (prematurely). ? Had a bowel movement before birth (meconium). ? Shows signs of difficulty transitioning from being inside the uterus to being outside of the uterus. If you are planning to breastfeed, your health care team will help you begin a feeding. Stage 3  The third stage of labor starts immediately after the birth of your baby and ends after you deliver the placenta. The placenta is an organ that develops  during pregnancy to provide oxygen and nutrients to your baby in the womb.  Delivering the placenta may require some pushing, and you may have mild contractions. Breastfeeding can stimulate contractions to help you deliver the placenta.  After the placenta is delivered, your uterus should tighten (contract) and become firm. This helps to stop bleeding in your uterus. To help your uterus contract and to control bleeding, your health care provider may: ? Give you medicine by injection, through an IV tube, by mouth, or through your rectum (rectally). ? Massage your abdomen or perform a vaginal exam to remove any blood clots that are left in your uterus. ? Empty your bladder by placing a thin, flexible tube (catheter) into your bladder. ? Encourage   you to breastfeed your baby. After labor is over, you and your baby will be monitored closely to ensure that you are both healthy until you are ready to go home. Your health care team will teach you how to care for yourself and your baby. This information is not intended to replace advice given to you by your health care provider. Make sure you discuss any questions you have with your health care provider. Document Released: 02/28/2008 Document Revised: 12/09/2015 Document Reviewed: 06/05/2015 Elsevier Interactive Patient Education  2018 [employer]  

## 2018-04-24 NOTE — Anesthesia Pain Management Evaluation Note (Signed)
  CRNA Pain Management Visit Note  Patient: Robin Lloyd, 27 y.o., female  "Hello [facility] am a [facility]. We have an anesthesia team available at all times to provide care throughout the hospital, including epidural management and anesthesia for C-section. I don't know your plan for the delivery whether it a natural birth, water birth, IV sedation, nitrous supplementation, doula or epidural, but we want to meet your pain goals."   1.Was your pain managed to your expectations on prior hospitalizations?   No prior hospitalizations  2.What is your expectation for pain management during this hospitalization?     Epidural  3.How can we help you reach that goal? Epidural at pain goal.  Record the patient's initial score and the patient's pain goal.   Pain: 6  Pain Goal: 8 [facility] wants you to be able to say your pain was always managed very well.  [name] 04/24/2018

## 2018-04-25 LAB — RPR: RPR: NONREACTIVE

## 2018-04-25 LAB — CBC
HEMATOCRIT: 31.1 % — AB (ref 36.0–46.0)
HEMOGLOBIN: 10.1 g/dL — AB (ref 12.0–15.0)
MCH: 27.6 pg (ref 26.0–34.0)
MCHC: 32.5 g/dL (ref 30.0–36.0)
MCV: 85 fL (ref 80.0–100.0)
NRBC: 0 % (ref 0.0–0.2)
Platelets: 180 10*3/uL (ref 150–400)
RBC: 3.66 MIL/uL — ABNORMAL LOW (ref 3.87–5.11)
RDW: 16.3 % — ABNORMAL HIGH (ref 11.5–15.5)
WBC: 8.1 10*3/uL (ref 4.0–10.5)

## 2018-04-25 NOTE — Progress Notes (Deleted)
MOB was referred for history of depression/anxiety. * Referral screened out by Clinical Social Worker because none of the following criteria appear to apply: ~ History of anxiety/depression during this pregnancy, or of post-partum depression following prior delivery. ~ Diagnosis of anxiety and/or depression within last 3 years OR * MOB's symptoms currently being treated with medication and/or therapy.  Please contact the Clinical Social Worker if needs arise, by [facility] request, or if MOB scores greater than 9/yes to question 10 on Edinburgh Postpartum Depression Screen.  CSW met with MOB in room 121 assess for needed resources and supports.  MOB denied having a need and reported, "I have a car seat that my mom has purchased, I just need another one for myself." CSW explained that CSW will be unable to assist with any additional car seats due to MOB currently having one and it's not essential for the family to have 2. MOB was understanding and declined all other community resources. MOB also denied having any psychosocial stressor and need for additional resources and supports.   There are no barriers to discharge.  Laurey Arrow, MSW, LCSW Clinical Social Work [phone]

## 2018-04-25 NOTE — Progress Notes (Signed)
Robin Lloyd is a 27 year old female s/p SVD on 04/24/18 at 1516 of a viable female infant, Apgars 9/9.  Subjective: Postpartum Day 1: Vaginal delivery, 2nd degree pernieal laceration Robin Lloyd feels well. She reports her bleeding is light and her pain is minimal. She is up ad lib, reports no syncope or dizziness. She is bottle feeding her baby girl. She has questions about procuring services and items for the baby, such as a car seat, formula, and bottles. [facility]  Objective: Vital signs in last 24 hours: Temp:  [97.9 F (36.6 C)-98.7 F (37.1 C)] 98.5 F (36.9 C) (11/21 2300) Pulse Rate:  [71-142] 71 (11/21 2300) Resp:  [16-20] 18 (11/21 2300) BP: (77-136)/(57-89) 114/74 (11/21 2300) SpO2:  [100 %] 100 % (11/21 1045)  Vitals:   04/24/18 1728 04/24/18 1755 04/24/18 1849 04/24/18 2300  BP: 136/77 111/79 120/89 114/74  Pulse: 98 82 98 71  Resp: 18 18 18 18   Temp: 97.9 F (36.6 C) 98.3 F (36.8 C) 98.7 F (37.1 C) 98.5 F (36.9 C)  TempSrc: Oral Oral Oral Oral  SpO2:        Physical Exam:  General: alert, cooperative and no distress Lochia: appropriate Uterine Fundus: firm Perineum: healing well DVT Evaluation: No evidence of DVT seen on physical exam.   CBC Latest Ref Rng & Units 04/24/2018 10/01/2017 09/10/2016  WBC 4.0 - 10.5 K/uL 5.9 5.6 5.5  Hemoglobin 12.0 - 15.0 g/dL 10.0(L) 11.2(L) 13.0  Hematocrit 36.0 - 46.0 % 32.0(L) 33.5(L) 38.3  Platelets 150 - 400 K/uL 173 239 290   RN will notify day shift RN to alert [employer] [facility] to the need for a car seat for discharge and follow up with  SW about other items needed by family.   Assessment: S/P SVD day 1 Bottlefeeding Stable Plan: Continue current care. Social Work Designer, industrial/productConsult [employer]er Services to provide Biomedical scientistcar seat Anticipate discharge Saturday  Gwynneth MunsonCarol Korianna Lloyd,CNM 04/25/2018, 12:27 AM

## 2018-04-25 NOTE — Anesthesia Postprocedure Evaluation (Signed)
Anesthesia Post Note  Patient: Robin Lloyd  Procedure(s) Performed: AN AD HOC LABOR EPIDURAL     Patient location during evaluation: Mother Baby Anesthesia Type: Epidural Level of consciousness: awake Pain management: pain level controlled Vital Signs Assessment: post-procedure vital signs reviewed and stable Respiratory status: spontaneous breathing Cardiovascular status: stable Postop Assessment: patient able to bend at knees, epidural receding, no backache and no headache Anesthetic complications: no    Last Vitals:  Vitals:   04/25/18 0307 04/25/18 0538  BP: 116/80 120/74  Pulse: 78 72  Resp: 16 16  Temp: 36.8 C   SpO2:      Last Pain:  Vitals:   04/25/18 0538  TempSrc:   PainSc: 6    Pain Goal:                 Edison PaceWILKERSON,Larone Kliethermes

## 2018-04-25 NOTE — Progress Notes (Signed)
MOB was referred for history of depression/anxiety. * Referral screened out by Clinical Social Worker because none of the following criteria appear to apply: ~ History of anxiety/depression during this pregnancy, or of post-partum depression following prior delivery. ~ Diagnosis of anxiety and/or depression within last 3 years OR * MOB's symptoms currently being treated with medication and/or therapy.  Please contact the Clinical Social Worker if needs arise, by [facility] request, or if MOB scores greater than 9/yes to question 10 on [place] Postpartum Depression Screen.  CSW met with MOB in room 121 assess for needed resources and supports.  MOB denied having a need and reported, "I have a car seat that my mom has purchased, I just need another one for myself." CSW explained that CSW will be unable to assist with any additional car seats due to MOB currently having one and it's not essential for the family to have 2. MOB was understanding and declined all other community resources. MOB also denied having any psychosocial stressor and need for additional resources and supports.   There are no barriers to discharge.  Laurey Arrow, MSW, LCSW Clinical Social Work [phone]

## 2018-04-26 MED ORDER — MEDROXYPROGESTERONE ACETATE 150 MG/ML IM SUSP
150.0000 mg | Freq: Once | INTRAMUSCULAR | Status: DC
  Filled 2018-04-26: qty 1

## 2018-04-26 MED ORDER — IBUPROFEN 600 MG PO TABS: 600.0000 mg | ORAL_TABLET | Freq: Four times a day (QID) | ORAL | 0 refills | Status: DC

## 2018-04-26 NOTE — Discharge Summary (Signed)
OB Discharge Summary  Robin GreenerLaqueenia Donatelli is a 27 year old female s/p SVD on 04/24/18 at 1516 of a viable female infant, Apgars 9/9.   Patient Name: Robin Lloyd DOB: 02-01-1991 MRN: [id number]  Date of admission: 04/24/2018 Delivering MD: Jaymes GraffILLARD, NAIMA   Date of discharge: 04/26/2018  Admitting diagnosis: 40wks CTX Intrauterine pregnancy: 4630w5d     Secondary diagnosis:  Active Problems:   Chlamydia   Vaginal delivery  Additional problems: none     Discharge diagnosis: Term Pregnancy Delivered                                                                                                Post partum procedures:none  Augmentation: AROM  Complications: None  Hospital course:  Onset of Labor With Vaginal Delivery     27 y.o. yo G2P1011 at 3930w5d was admitted in Active Labor on 04/24/2018. Patient had an uncomplicated labor course as follows:  Membrane Rupture Time/Date: 11:42 AM ,04/24/2018   Intrapartum Procedures: Episiotomy: None [1]                                         Lacerations:  2nd degree [3];Perineal [11]  Patient had a delivery of a Viable infant. 04/24/2018  Information for the patient's newborn:  [name], Girl Marny LowensteinLaqueenia [[id number]][[id number]]  Delivery Method: Vag-Spont    Pateint had an uncomplicated postpartum course.  She is ambulating, tolerating a regular diet, passing flatus, and urinating well. Patient is discharged home in stable condition on 04/26/18.   Physical exam  Vitals:   04/25/18 0538 04/25/18 1423 04/25/18 2220 04/26/18 0540  BP: 120/74 127/83 108/68 132/84  Pulse: 72 89 87 69  Resp: 16 18 20 16   Temp:  98.3 F (36.8 C) 98.1 F (36.7 C) 98.1 F (36.7 C)  TempSrc:   Oral Oral  SpO2:  100%  98%   General: alert, cooperative and no distress Lochia: appropriate Uterine Fundus: firm Incision: N/A DVT Evaluation: No evidence of DVT seen on physical exam. Labs: Lab Results  Component Value Date   WBC 8.1 04/25/2018   HGB 10.1 (L) 04/25/2018    HCT 31.1 (L) 04/25/2018   MCV 85.0 04/25/2018   PLT 180 04/25/2018   CMP Latest Ref Rng & Units 10/01/2017  Glucose 65 - 99 mg/dL 92  BUN 6 - 20 mg/dL 6  Creatinine 7.820.44 - 9.561.00 mg/dL 2.130.53  Sodium 086135 - 578145 mmol/L 134(L)  Potassium 3.5 - 5.1 mmol/L 3.6  Chloride 101 - 111 mmol/L 103  CO2 22 - 32 mmol/L 22  Calcium 8.9 - 10.3 mg/dL 9.6  Total Protein 6.5 - 8.1 g/dL 7.8  Total Bilirubin 0.3 - 1.2 mg/dL 4.6(N0.2(L)  Alkaline Phos 38 - 126 U/L 29(L)  AST 15 - 41 U/L 19  ALT 14 - 54 U/L 9(L)    Discharge instruction: per After Visit Summary and "Baby and Me Booklet".  After visit meds:  Allergies as of 04/26/2018   No Known Allergies  Medication List    TAKE these medications   ibuprofen 600 MG tablet Commonly known as:  ADVIL,MOTRIN Take 1 tablet (600 mg total) by mouth every 6 (six) hours.   PRENATAL PO Take 1 tablet by mouth daily.       Diet: routine diet  Activity: Advance as tolerated. Pelvic rest for 6 weeks.   Outpatient follow up:6 weeks Follow up Appt:No future appointments. Follow up Visit:No follow-ups on file.  Postpartum contraception: Depo Provera  Newborn Data: Live born female  Birth Weight: 8 lb 0.6 oz (3646 g) APGAR: 9, 9  Newborn Delivery   Birth date/time:  04/24/2018 15:47:00 Delivery type:  Vaginal, Spontaneous     Baby Feeding: Bottle Disposition:home with mother   04/26/2018 Jonetta Speak, CNM

## 2018-05-06 ENCOUNTER — Inpatient Hospital Stay (HOSPITAL_COMMUNITY)
Admission: AD | Admit: 2018-05-06 | Discharge: 2018-05-09 | DRG: 776 | Disposition: A | Discharge status: Home / Self Care | Payer: Medicaid Other | Attending: Obstetrics and Gynecology | Admitting: Obstetrics and Gynecology

## 2018-05-06 ENCOUNTER — Other Ambulatory Visit: Payer: Self-pay

## 2018-05-06 DIAGNOSIS — O1405 Mild to moderate pre-eclampsia, complicating the puerperium: Principal | ICD-10-CM | POA: Diagnosis present

## 2018-05-06 DIAGNOSIS — O159 Eclampsia, unspecified as to time period: Secondary | ICD-10-CM | POA: Insufficient documentation

## 2018-05-06 DIAGNOSIS — O1490 Unspecified pre-eclampsia, unspecified trimester: Secondary | ICD-10-CM | POA: Diagnosis present

## 2018-05-06 LAB — COMPREHENSIVE METABOLIC PANEL
ALT: 19 U/L (ref 0–44)
AST: 29 U/L (ref 15–41)
Albumin: 3.5 g/dL (ref 3.5–5.0)
Alkaline Phosphatase: 69 U/L (ref 38–126)
Anion gap: 9 (ref 5–15)
BUN: 13 mg/dL (ref 6–20)
CHLORIDE: 106 mmol/L (ref 98–111)
CO2: 22 mmol/L (ref 22–32)
CREATININE: 0.55 mg/dL (ref 0.44–1.00)
Calcium: 9 mg/dL (ref 8.9–10.3)
GFR calc non Af Amer: >60 mL/min (ref >60)
Glucose, Bld: 107 mg/dL — ABNORMAL HIGH (ref 70–99)
Potassium: 3.3 mmol/L — ABNORMAL LOW (ref 3.5–5.1)
SODIUM: 137 mmol/L (ref 135–145)
Total Bilirubin: 0.7 mg/dL (ref 0.3–1.2)
Total Protein: 7.7 g/dL (ref 6.5–8.1)

## 2018-05-06 LAB — CBC
HCT: 38.4 % (ref 36.0–46.0)
Hemoglobin: 11.9 g/dL — ABNORMAL LOW (ref 12.0–15.0)
MCH: 26.2 pg (ref 26.0–34.0)
MCHC: 31 g/dL (ref 30.0–36.0)
MCV: 84.6 fL (ref 80.0–100.0)
PLATELETS: 321 10*3/uL (ref 150–400)
RBC: 4.54 MIL/uL (ref 3.87–5.11)
RDW: 17.9 % — ABNORMAL HIGH (ref 11.5–15.5)
WBC: 6.4 10*3/uL (ref 4.0–10.5)
nRBC: 0 % (ref 0.0–0.2)

## 2018-05-06 NOTE — MAU Note (Deleted)
Pt walked off unit

## 2018-05-06 NOTE — MAU Note (Signed)
Per pt. She got up in the middle of night last night to get a drink. She fell, her boyfriend came to check on her, he saw her with her arm clinched and head back and told her she had a seizure.  Pt. Called MD this AM. Pt. Heard back from MD this PM. Pt. Was told to come to MAU for eval.

## 2018-05-07 ENCOUNTER — Encounter (HOSPITAL_COMMUNITY): Payer: Self-pay

## 2018-05-07 DIAGNOSIS — O1495 Unspecified pre-eclampsia, complicating the puerperium: Secondary | ICD-10-CM | POA: Diagnosis present

## 2018-05-07 DIAGNOSIS — O1405 Mild to moderate pre-eclampsia, complicating the puerperium: Secondary | ICD-10-CM | POA: Diagnosis not present

## 2018-05-07 DIAGNOSIS — O1490 Unspecified pre-eclampsia, unspecified trimester: Secondary | ICD-10-CM | POA: Diagnosis present

## 2018-05-07 LAB — PROTEIN / CREATININE RATIO, URINE
CREATININE, URINE: 136 mg/dL
Protein Creatinine Ratio: 0.43 mg/mg{Cre} — ABNORMAL HIGH (ref 0.00–0.15)
Total Protein, Urine: 58 mg/dL

## 2018-05-07 MED ORDER — MAGNESIUM SULFATE BOLUS VIA INFUSION
4.0000 g | Freq: Once | INTRAVENOUS | Status: AC
  Administered 2018-05-07: 4 g via INTRAVENOUS
  Filled 2018-05-07: qty 500

## 2018-05-07 MED ORDER — LABETALOL HCL 5 MG/ML IV SOLN: 80.0000 mg | INTRAVENOUS | Status: DC | PRN

## 2018-05-07 MED ORDER — HYDRALAZINE HCL 20 MG/ML IJ SOLN: 10.0000 mg | INTRAMUSCULAR | Status: DC | PRN

## 2018-05-07 MED ORDER — MAGNESIUM SULFATE 40 G IN LACTATED RINGERS - SIMPLE
2.0000 g/h | INTRAVENOUS | Status: AC
  Administered 2018-05-07: 2 g/h via INTRAVENOUS
  Filled 2018-05-07 (×2): qty 500

## 2018-05-07 MED ORDER — LABETALOL HCL 5 MG/ML IV SOLN: 20.0000 mg | INTRAVENOUS | Status: DC | PRN

## 2018-05-07 MED ORDER — LACTATED RINGERS IV SOLN
INTRAVENOUS | Status: DC
  Administered 2018-05-07: 03:00:00 via INTRAVENOUS

## 2018-05-07 MED ORDER — LABETALOL HCL 5 MG/ML IV SOLN: 40.0000 mg | INTRAVENOUS | Status: DC | PRN

## 2018-05-07 MED ORDER — LACTATED RINGERS IV SOLN: INTRAVENOUS | Status: DC

## 2018-05-07 NOTE — H&P (Signed)
Robin GreenerLaqueenia Lloyd is a 27 y.o. female, G2P1 at 7622w6d postpartum, presenting for preeclampsia evaluation. [facility]  Provider from clinic called to report that they have been trying to contact her to come to the hospital for the past week due to PCR 0.35, slightly elevated LFTs (ALT 30, AST 59), blood pressure had been 130/80 in clinic on 11/25. Then on the morning of 05/06/18, pt had called clinic to report she had a seizure overnight at around 0300.  Pt reports that she was walking around her home looking for a drink and then woke up on the floor and her partner stated she slumped onto the floor and her eyes rolled back. He did not report any convulsions and pt regained full consciousness after two minutes. We discussed that she may have had a syncopal episode and patient said she had a history of "black outs" which sound like syncopal episodes where she loses consciousness for a brief period and then comes to, the last one occurring about 6 years ago. I asked her if she had eaten properly or been well hydrated. She said she had eaten a full supper but did not drink enough water, drinking very little throughout the day.   She denies headache, epigastric pain, and visual changes. Pt had four B/Ps in MAU that were 140s/90s.  Patient Active Problem List   Diagnosis Date Noted  . Vaginal delivery 04/26/2018  . Chlamydia   . Chlamydia infection affecting pregnancy--dx 11/24, no treatment until 12/14, still positive 1/2--treated 1/2 in MAU, still positive on 06/05/14 test. 06/08/2014  . Echogenic focus of bowel of fetal affecting antepartum care of mother   . Underweight 06/05/2014  . GERD (gastroesophageal reflux disease) 06/05/2014  . History of anemia 06/05/2014  . Anxiety state 06/05/2014  . Ovarian cyst 06/05/2014  . Abnormal Pap smear of cervix 06/05/2014      OB History    Gravida  2   Para  1   Term  1   Preterm  0   AB  1   Living  1     SAB      TAB      Ectopic      Multiple  0    Live Births  1        Obstetric Comments  Premature rupture at 5 months.       Past Medical History:  Diagnosis Date  . Anxiety   . Chlamydia   . GERD (gastroesophageal reflux disease)   . Ovarian cyst   . Vaginal Pap smear, abnormal    Past Surgical History:  Procedure Laterality Date  . DILATION AND CURETTAGE OF UTERUS  2015   Rupture of membranes   Family History: family history is not on file. Social History:  reports that she has never smoked. She has never used smokeless tobacco. She reports that she does not drink alcohol or use drugs.         ROS:  All 10 systems reviewed and negative except as noted.  No Known Allergies     Blood pressure (!) 136/97, pulse 80, temperature 98.5 F (36.9 C), temperature source Tympanic, resp. rate 18, height 5\' 2"  (1.575 m), weight 72.6 kg, last menstrual period 07/21/2017, SpO2 100 %, unknown if currently breastfeeding.  Vitals:   05/07/18 0031 05/07/18 0046 05/07/18 0116 05/07/18 0131  BP: (!) 141/97 (!) 136/97 (!) 138/91 127/80  Pulse: 72 80 71 72  Resp:      Temp:  TempSrc:      SpO2:      Weight:      Height:        Chest clear Heart RRR without murmur Abd soft, NT Ext: no edema, no sign of DVT   Prenatal labs: ABO, Rh: --/--/B POS (11/21 0925) Antibody: NEG (11/21 0925) Rubella:  Immune (05/20 0000) RPR: Non Reactive (11/21 0925)  HBsAg: Negative (05/20 0000)  HIV: Non-reactive (05/20 0000)  GBS: Positive (11/21 0000)   Assessment: G2P1 at [redacted]w[redacted]d postpartum Preeclampsia  Plan: Admit to third floor for magnesium infusion per Dr. Jacqulyn Liner, CNM 05/07/2018, 1:17 AM

## 2018-05-07 NOTE — Progress Notes (Signed)
Per HPI on H&P: Robin Lloyd is a 27 y.o. female, G2P1 at [redacted]w[redacted]d postpartum, presenting for preeclampsia evaluation. [facility]  Provider from clinic called to report that they have been trying to contact her to come to the hospital for the past week due to PCR 0.35, slightly elevated LFTs (ALT 30, AST 59), blood pressure had been 130/80 in clinic on 11/25. Then on the morning of 05/06/18, pt had called clinic to report she had a seizure overnight at around 0300.  Pt reports that she was walking around her home looking for a drink and then woke up on the floor and her partner stated she slumped onto the floor and her eyes rolled back. He did not report any convulsions and pt regained full consciousness after two minutes. We discussed that she may have had a syncopal episode and patient said she had a history of "black outs" which sound like syncopal episodes where she loses consciousness for a brief period and then comes to, the last one occurring about 6 years ago. I asked her if she had eaten properly or been well hydrated. She said she had eaten a full supper but did not drink enough water, drinking very little throughout the day.   She denies headache, epigastric pain, and visual changes. Pt had four B/Ps in MAU that were 140s/90s.  Subjective: Postpartum Day # 13 : S/P NSVD due to spontaneous labor. Pt was found in room sleeping, and endorses having been sleeping since the magnesium was started. Patient up ad lib, denies syncope or dizziness, during hospital stay. Reports consuming regular diet without issues and denies N/V. Patient reports 1 bowel movement + passing flatus.  Denies issues with urination and reports bleeding is "very light."  Patient is Bottle feeding and reports going well, baby visited her today, but was not in room for exam. Pain is being appropriately managed with use of motrin. Pt denies HA, ruq pain or vision changes, no cp or sob.   2nd degree laceration, healed well. Feeding:   Bottle Contraceptive plan:  Deo-provera at discharge post birth on the 04/26/2018  Objective: Vital signs in last 24 hours: Patient Vitals for the past 24 hrs:  BP Temp Temp src Pulse Resp SpO2 Height Weight  05/07/18 1125 (!) 137/95 98.2 F (36.8 C) Oral 92 16 99 % - -  05/07/18 1000 138/84 - - 87 18 99 % - -  05/07/18 0900 121/89 98.4 F (36.9 C) Oral 87 16 99 % - -  05/07/18 0800 118/82 - - 78 16 - - -  05/07/18 0701 123/82 - - 87 16 98 % - -  05/07/18 0601 116/80 98.7 F (37.1 C) Oral 78 15 99 % - -  05/07/18 0501 (!) 135/95 - - 75 16 98 % - -  05/07/18 0402 123/82 - - 87 16 100 % - -  05/07/18 0301 (!) 138/96 - - 83 15 99 % - -  05/07/18 0247 (!) 128/94 - - 78 16 98 % - -  05/07/18 0232 (!) 131/99 - - 80 18 98 % - -  05/07/18 0216 (!) 140/102 - - 78 16 99 % - -  05/07/18 0204 (!) 138/103 - - 74 16 99 % - -  05/07/18 0158 (!) 133/101 98.2 F (36.8 C) Oral 78 17 100 % - -  05/07/18 0131 127/80 - - 72 - - - -  05/07/18 0116 (!) 138/91 - - 71 - - - -  05/07/18 0046 (!) 136/97 - -  80 - - - -  05/07/18 0031 (!) 141/97 - - 72 - - - -  05/07/18 0016 (!) 145/97 - - 74 - - - -  05/07/18 0001 (!) 143/90 - - 74 - - - -  05/06/18 2340 (!) 148/98 - - 80 - - - -  05/06/18 2301 133/77 - - 87 - - - -  05/06/18 2250 - - - - - 100 % - -  05/06/18 2245 (!) 129/94 98.5 F (36.9 C) Tympanic 97 18 100 % 5\' 2"  (1.575 m) 72.6 kg     Physical Exam:  General: alert, cooperative, appears stated age and no distress Mood/Affect: Happy Lungs: clear to auscultation, no wheezes, rales or rhonchi, symmetric air entry.  Heart: normal rate, regular rhythm, normal S1, S2, no murmurs, rubs, clicks or gallops. Breast: breasts appear normal, no suspicious masses, no skin or nipple changes or axillary nodes. Abdomen:  + bowel sounds, soft, non-tender GU: perineum intact, healing well. No signs of external hematomas.  Uterine Fundus: firm Lochia: appropriate Skin: Warm, Dry. DVT Evaluation: No evidence  of DVT seen on physical exam. Negative Homan's sign. No cords or calf tenderness. No significant calf/ankle edema. 2+ DTR patellar, no clonus.   CBC Latest Ref Rng & Units 05/06/2018 04/25/2018 04/24/2018  WBC 4.0 - 10.5 K/uL 6.4 8.1 5.9  Hemoglobin 12.0 - 15.0 g/dL 11.9(L) 10.1(L) 10.0(L)  Hematocrit 36.0 - 46.0 % 38.4 31.1(L) 32.0(L)  Platelets 150 - 400 K/uL 321 180 173    Results for orders placed or performed during the hospital encounter of 05/06/18 (from the past 24 hour(s))  CBC     Status: Abnormal   Collection Time: 05/06/18 11:18 PM  Result Value Ref Range   WBC 6.4 4.0 - 10.5 K/uL   RBC 4.54 3.87 - 5.11 MIL/uL   Hemoglobin 11.9 (L) 12.0 - 15.0 g/dL   HCT 16.1 09.6 - 04.5 %   MCV 84.6 80.0 - 100.0 fL   MCH 26.2 26.0 - 34.0 pg   MCHC 31.0 30.0 - 36.0 g/dL   RDW 40.9 (H) 81.1 - 91.4 %   Platelets 321 150 - 400 K/uL   nRBC 0.0 0.0 - 0.2 %  Comprehensive metabolic panel     Status: Abnormal   Collection Time: 05/06/18 11:18 PM  Result Value Ref Range   Sodium 137 135 - 145 mmol/L   Potassium 3.3 (L) 3.5 - 5.1 mmol/L   Chloride 106 98 - 111 mmol/L   CO2 22 22 - 32 mmol/L   Glucose, Bld 107 (H) 70 - 99 mg/dL   BUN 13 6 - 20 mg/dL   Creatinine, Ser 7.82 0.44 - 1.00 mg/dL   Calcium 9.0 8.9 - 95.6 mg/dL   Total Protein 7.7 6.5 - 8.1 g/dL   Albumin 3.5 3.5 - 5.0 g/dL   AST 29 15 - 41 U/L   ALT 19 0 - 44 U/L   Alkaline Phosphatase 69 38 - 126 U/L   Total Bilirubin 0.7 0.3 - 1.2 mg/dL   GFR calc non Af Amer >60 >60 mL/min   GFR calc Af Amer >60 >60 mL/min   Anion gap 9 5 - 15  Protein / creatinine ratio, urine     Status: Abnormal   Collection Time: 05/06/18 11:37 PM  Result Value Ref Range   Creatinine, Urine 136.00 mg/dL   Total Protein, Urine 58 mg/dL   Protein Creatinine Ratio 0.43 (H) 0.00 - 0.15 mg/mg[Cre]     CBG (  last 3)  No results for input(s): GLUCAP in the last 72 hours.   I/O last 3 completed shifts: In: 435.6 [P.O.:120; I.V.:315.6] Out: 700  [Urine:700]   Assessment Postpartum Day # 13 : S/P NSVD due to spontaneous labor, delivered on 04/24/2018, is currently a readmit for PP preeclampsia with PCR at [facility] at 0.35. Current BP 135/95, AST 29, ALT 19, Creatinine , Platelet 321, HGB 11.9, PCR 0.43,  no s/sx, on magnesium, started on 12/04 @ 0200am.. Pt has h/o black-outs and believes she has one 1 day ago.  Pt stable. Bottle feeding baby which is at home now. Hemodynamically stable. Pt was on HCTZ at home PP for swelling gin hands and feet, not currently taking in hospital.   Plan: Continue other mgmt as ordered PP preeclampsia: Continue magnesium until 0230 in this morning then stop. Monitor output, bp, neuro checks, may be discharged to home in morning if BP stable.  VTE prophylactics: Early ambulated as tolerates, SCD on.  Pain control: Motrin/Tylenol PRN Education given regarding options for contraception, including injectable contraception.  Plan for discharge tomorrow  Depo provera: 04/26/2018 given PP.  Dr. Estanislado Pandyivard to be updated on patient status  Amorah Sebring NP-C, CNM 05/07/2018, 3:09 PM

## 2018-05-08 LAB — CBC WITH DIFFERENTIAL/PLATELET
BASOS PCT: 1 %
Basophils Absolute: 0 10*3/uL (ref 0.0–0.1)
Eosinophils Absolute: 0.1 10*3/uL (ref 0.0–0.5)
Eosinophils Relative: 2 %
HEMATOCRIT: 39.1 % (ref 36.0–46.0)
Hemoglobin: 12.7 g/dL (ref 12.0–15.0)
Lymphocytes Relative: 15 %
Lymphs Abs: 0.7 10*3/uL (ref 0.7–4.0)
MCH: 26.6 pg (ref 26.0–34.0)
MCHC: 32.5 g/dL (ref 30.0–36.0)
MCV: 82 fL (ref 80.0–100.0)
MONOS PCT: 6 %
Monocytes Absolute: 0.3 10*3/uL (ref 0.1–1.0)
NEUTROS ABS: 3.4 10*3/uL (ref 1.7–7.7)
NEUTROS PCT: 76 %
Platelets: 295 10*3/uL (ref 150–400)
RBC: 4.77 MIL/uL (ref 3.87–5.11)
RDW: 18.2 % — ABNORMAL HIGH (ref 11.5–15.5)
WBC: 4.5 10*3/uL (ref 4.0–10.5)

## 2018-05-08 LAB — COMPREHENSIVE METABOLIC PANEL
ALBUMIN: 3.4 g/dL — AB (ref 3.5–5.0)
ALK PHOS: 72 U/L (ref 38–126)
ALT: 16 U/L (ref 0–44)
AST: 24 U/L (ref 15–41)
Anion gap: 8 (ref 5–15)
BILIRUBIN TOTAL: 0.5 mg/dL (ref 0.3–1.2)
BUN: 9 mg/dL (ref 6–20)
CO2: 21 mmol/L — ABNORMAL LOW (ref 22–32)
Calcium: 8.1 mg/dL — ABNORMAL LOW (ref 8.9–10.3)
Chloride: 109 mmol/L (ref 98–111)
Creatinine, Ser: 0.53 mg/dL (ref 0.44–1.00)
GFR calc Af Amer: >60 mL/min (ref >60)
GFR calc non Af Amer: >60 mL/min (ref >60)
GLUCOSE: 93 mg/dL (ref 70–99)
Potassium: 3.9 mmol/L (ref 3.5–5.1)
Sodium: 138 mmol/L (ref 135–145)
TOTAL PROTEIN: 7.7 g/dL (ref 6.5–8.1)

## 2018-05-08 MED ORDER — NIFEDIPINE ER OSMOTIC RELEASE 30 MG PO TB24
30.0000 mg | ORAL_TABLET | Freq: Every day | ORAL | Status: DC
Start: 2018-05-08 — End: 2018-05-09
  Administered 2018-05-08 – 2018-05-09 (×2): 30 mg via ORAL
  Filled 2018-05-08 (×2): qty 1

## 2018-05-08 NOTE — Lactation Note (Signed)
LC Visit:  Mother is bottle feeding only and does not require LC services.  Her breasts are soft and non tender and she had no questions/concerns.

## 2018-05-08 NOTE — Progress Notes (Addendum)
Post Partum Day 14 Subjective: no complaints, up ad lib, voiding, tolerating PO and denies symptoms of preeclampsia: headache, vision changes, epigastric pain. Patient's blood pressures have been elevated this morning after magnesium was discontinued. Consulted Dr. Normand Sloop: start patient on Procardia 30mg  XL QD. Discussed this with patient and how we cannot discharge her until her blood pressures are stable.   Objective: Vitals:   05/07/18 1958 05/07/18 2333 05/08/18 0410 05/08/18 0733  BP: (!) 147/105 139/84 (!) 137/103 (!) 157/106  Pulse: 73 87 71 83  Resp: 18 20 18 18   Temp: 98.5 F (36.9 C) 98.5 F (36.9 C) 97.9 F (36.6 C) 98.9 F (37.2 C)  TempSrc: Oral Oral Oral Oral  SpO2: 99% 99% 100% 100%  Weight:      Height:       Physical Exam:  General: alert and cooperative Lochia: appropriate DVT Evaluation: No evidence of DVT seen on physical exam. Negative Homan's sign. No cords or calf tenderness. No significant calf/ankle edema.  Results for orders placed or performed during the hospital encounter of 05/06/18 (from the past 24 hour(s))  CBC with Differential/Platelet     Status: Abnormal   Collection Time: 05/08/18  8:04 AM  Result Value Ref Range   WBC 4.5 4.0 - 10.5 K/uL   RBC 4.77 3.87 - 5.11 MIL/uL   Hemoglobin 12.7 12.0 - 15.0 g/dL   HCT 40.9 81.1 - 91.4 %   MCV 82.0 80.0 - 100.0 fL   MCH 26.6 26.0 - 34.0 pg   MCHC 32.5 30.0 - 36.0 g/dL   RDW 78.2 (H) 95.6 - 21.3 %   Platelets 295 150 - 400 K/uL   Neutrophils Relative % 76 %   Neutro Abs 3.4 1.7 - 7.7 K/uL   Lymphocytes Relative 15 %   Lymphs Abs 0.7 0.7 - 4.0 K/uL   Monocytes Relative 6 %   Monocytes Absolute 0.3 0.1 - 1.0 K/uL   Eosinophils Relative 2 %   Eosinophils Absolute 0.1 0.0 - 0.5 K/uL   Basophils Relative 1 %   Basophils Absolute 0.0 0.0 - 0.1 K/uL  Comprehensive metabolic panel     Status: Abnormal   Collection Time: 05/08/18  8:04 AM  Result Value Ref Range   Sodium 138 135 - 145 mmol/L   Potassium 3.9 3.5 - 5.1 mmol/L   Chloride 109 98 - 111 mmol/L   CO2 21 (L) 22 - 32 mmol/L   Glucose, Bld 93 70 - 99 mg/dL   BUN 9 6 - 20 mg/dL   Creatinine, Ser 0.86 0.44 - 1.00 mg/dL   Calcium 8.1 (L) 8.9 - 10.3 mg/dL   Total Protein 7.7 6.5 - 8.1 g/dL   Albumin 3.4 (L) 3.5 - 5.0 g/dL   AST 24 15 - 41 U/L   ALT 16 0 - 44 U/L   Alkaline Phosphatase 72 38 - 126 U/L   Total Bilirubin 0.5 0.3 - 1.2 mg/dL   GFR calc non Af Amer >60 >60 mL/min   GFR calc Af Amer >60 >60 mL/min   Anion gap 8 5 - 15   Assessment/Plan: Postpartum day 14 Postpartum preeclampsia S/p magnesium sulfate infusion Start Procardia XL 30mg  QD for hypertension Will consider discharge when her blood pressures are stable Baby love RN order placed as she will need postpartum blood pressure checks    LOS: 1 day   Janeece Riggers 05/08/2018, 9:02 AM   Pt is doing well .  Labs WNL.  Plan for discharge  tomorrow and follow up in the office.

## 2018-05-09 MED ORDER — NIFEDIPINE ER 30 MG PO TB24: 30.0000 mg | ORAL_TABLET | Freq: Every day | ORAL | 0 refills | Status: DC

## 2018-05-09 NOTE — Progress Notes (Signed)
Pt discharged with printed instructions. Pt verbalized an understanding. No concerns noted. Pericles Carmicheal L Adolphe Fortunato, RN 

## 2018-05-09 NOTE — Discharge Summary (Signed)
OB Discharge Summary     Patient Name: Robin Lloyd DOB: 29-Mar-1991 MRN: [id number]  Date of admission: 05/06/2018 Delivering MD: Jaymes GraffILLARD, NAIMA   Date of discharge: 05/09/2018  Admitting diagnosis: PP had a seizure Intrauterine pregnancy: 784w5d     Secondary diagnosis:  Active Problems:   Preeclampsia  Additional problems: none     Discharge diagnosis: Preeclampsia (mild)                                                                                                Hospital course:  Readmitted at 13 days postpartum with new dx of preeclampsia based on BP and UPCR >0.3 and her report of a possible seizure on 12/3. This is now believed to have been syncope. She does have preeclampsia, no evidence of severe features. BP stable on nifedipine 30mg  XL  Physical exam  Vitals:   05/08/18 1601 05/08/18 2026 05/08/18 2333 05/09/18 0452  BP: 130/88 (!) 141/93 122/86 (!) 136/98  Pulse: 86 66 77 71  Resp: 18 18 18 18   Temp: 98.4 F (36.9 C) 98.7 F (37.1 C) 98.5 F (36.9 C) 99.1 F (37.3 C)  TempSrc: Oral Oral Oral Oral  SpO2: 100% 99% 99% 100%  Weight:      Height:        Labs: Lab Results  Component Value Date   WBC 4.5 05/08/2018   HGB 12.7 05/08/2018   HCT 39.1 05/08/2018   MCV 82.0 05/08/2018   PLT 295 05/08/2018   CMP Latest Ref Rng & Units 05/08/2018  Glucose 70 - 99 mg/dL 93  BUN 6 - 20 mg/dL 9  Creatinine 1.190.44 - 1.471.00 mg/dL 8.290.53  Sodium 562135 - 130145 mmol/L 138  Potassium 3.5 - 5.1 mmol/L 3.9  Chloride 98 - 111 mmol/L 109  CO2 22 - 32 mmol/L 21(L)  Calcium 8.9 - 10.3 mg/dL 8.1(L)  Total Protein 6.5 - 8.1 g/dL 7.7  Total Bilirubin 0.3 - 1.2 mg/dL 0.5  Alkaline Phos 38 - 126 U/L 72  AST 15 - 41 U/L 24  ALT 0 - 44 U/L 16    Discharge instruction: per After Visit Summary and "Baby and Me Booklet".  After visit meds:  Allergies as of 05/09/2018   No Known Allergies     Medication List    STOP taking these medications   hydrochlorothiazide 25 MG  tablet Commonly known as:  HYDRODIURIL     TAKE these medications   ibuprofen 600 MG tablet Commonly known as:  ADVIL,MOTRIN Take 1 tablet (600 mg total) by mouth every 6 (six) hours. What changed:    when to take this  reasons to take this   NIFEdipine 30 MG 24 hr tablet Commonly known as:  ADALAT CC Take 1 tablet (30 mg total) by mouth daily.       Diet: routine diet  Activity: Advance as tolerated. Pelvic rest for 6 weeks.   Outpatient follow up:1-2 week; baby love referral for the next week    Preeclampsia postpartum - readmitted for ?eclamptic seizure that is now thought to have been a syncopal episode due  to lack of symptoms and normal labs.  -Continue nifedipine 30mg  XL daily.  -Baby Love home visit planned in the next week.  -f/u in office in the next 1-2 weeks.  -Reviewed s/s preE, worsening BP that needs f/u. Does not need to check BP regularly. Can check if having new or worse HA or RUQ pain. Call office if >140 SBP or >90 DBP for more than 30-75min or if >150 or >100 anytime. This may be reason to adjust medication.     05/09/2018 Robin Lloyd, CNM

## 2018-05-09 NOTE — Progress Notes (Signed)
Post Partum Day 15; Hospital readmit day 2 Subjective: no complaints, up ad lib, voiding, tolerating PO and No HA, blurred vision, RUQ pain. No CP or SOB. ready to go home today. Minimal VB. FF, no breast c/o.  BP stable since starting nifedipine   Objective: Blood pressure (!) 136/98, pulse 71, temperature 99.1 F (37.3 C), temperature source Oral, resp. rate 18, height 5\' 2"  (1.575 m), weight 72.6 kg, last menstrual period 07/21/2017, SpO2 100 %, unknown if currently breastfeeding.  Physical Exam:  General: alert, cooperative and no distress DVT Evaluation: No significant calf/ankle edema.  Recent Labs    05/06/18 2318 05/08/18 0804  HGB 11.9* 12.7  HCT 38.4 39.1   CBC, CMP wnl. No recent labs.  Assessment/Plan: Discharge home  Preeclampsia postpartum - readmitted for ?eclamptic seizure that is now thought to have been a syncopal episode due to lack of symptoms and normal labs.  -Continue nifedipine 30mg  XL daily.  -Baby [facility] home visit planned in the next week.  -f/u in office in the next 1-2 weeks.  -Reviewed s/s preE, worsening BP that needs f/u. Does not need to check BP regularly. Can check if having new or worse HA or RUQ pain. Call office if >140 SBP or >90 DBP for more than 30-6760min or if >150 or >100 anytime. This may be reason to adjust medication.     LOS: 2 days   Faylene MillionKathleen A Aveen Stansel 05/09/2018, 8:41 AM

## 2019-03-03 ENCOUNTER — Other Ambulatory Visit: Payer: Self-pay

## 2019-03-03 ENCOUNTER — Encounter (HOSPITAL_COMMUNITY): Payer: Self-pay | Admitting: Emergency Medicine

## 2019-03-03 ENCOUNTER — Emergency Department (HOSPITAL_COMMUNITY)
Admission: EM | Admit: 2019-03-03 | Discharge: 2019-03-03 | Disposition: A | Discharge status: Home / Self Care | Payer: Medicaid Other | Attending: Emergency Medicine | Admitting: Emergency Medicine

## 2019-03-03 ENCOUNTER — Emergency Department (HOSPITAL_COMMUNITY): Payer: Medicaid Other

## 2019-03-03 DIAGNOSIS — M25462 Effusion, left knee: Secondary | ICD-10-CM | POA: Insufficient documentation

## 2019-03-03 DIAGNOSIS — Y999 Unspecified external cause status: Secondary | ICD-10-CM | POA: Diagnosis not present

## 2019-03-03 DIAGNOSIS — W109XXA Fall (on) (from) unspecified stairs and steps, initial encounter: Secondary | ICD-10-CM | POA: Diagnosis not present

## 2019-03-03 DIAGNOSIS — M25562 Pain in left knee: Secondary | ICD-10-CM | POA: Diagnosis present

## 2019-03-03 DIAGNOSIS — Y9389 Activity, other specified: Secondary | ICD-10-CM | POA: Diagnosis not present

## 2019-03-03 DIAGNOSIS — Y929 Unspecified place or not applicable: Secondary | ICD-10-CM | POA: Diagnosis not present

## 2019-03-03 MED ORDER — NAPROXEN 500 MG PO TABS: 500.0000 mg | ORAL_TABLET | Freq: Two times a day (BID) | ORAL | 0 refills | Status: DC

## 2019-03-03 NOTE — Discharge Instructions (Addendum)
Take naproxen 2 times a day with meals.  Do not take other anti-inflammatories at the same time (Advil, Motrin, ibuprofen, Aleve). You may supplement with Tylenol if you need further pain control. Use ice packs, 20 minutes at a time, 3 times a day.  Use the knee immobilizer and crutches for symptom control.  Follow up with the orthopedic doctor in 1 week if you pain is not improving.  Return to the ER with any new, worsening, or concerning symptoms.

## 2019-03-03 NOTE — ED Provider Notes (Signed)
[facility]-EMERGENCY DEPT Provider Note   CSN: [id number] Arrival date & time: 03/03/19  4403     History   Chief Complaint Chief Complaint  Patient presents with  . Knee Injury    HPI Cortni Lloyd is a 28 y.o. female ending for evaluation of left knee pain.  Patient states last night she fell down 6 or 7 stairs while carrying a shoulder.  She felt her left knee twist and pop.  She had acute onset pain.  Patient has been able to ambulate and weight-bear, but reports worse pain.  Patient states since last night, her pain is worsening, she also reports worsening swelling of her knee.  Pain is mostly in the anterior aspect.  She states that feels tight and is hard to bend.  She took 600 mg of Advil last night without improvement of symptoms.  Patient denies injury elsewhere, denies hitting her head.  She denies numbness or tingling.  No previous history of knee problems, she does not have an orthopedic doctor.     HPI  Past Medical History:  Diagnosis Date  . Anxiety   . Chlamydia   . GERD (gastroesophageal reflux disease)   . Ovarian cyst   . Vaginal Pap smear, abnormal     Patient Active Problem List   Diagnosis Date Noted  . Preeclampsia 05/07/2018  . Chlamydia   . Chlamydia infection affecting pregnancy--dx 11/24, no treatment until 12/14, still positive 1/2--treated 1/2 in MAU, still positive on 06/05/14 test. 06/08/2014  . Echogenic focus of bowel of fetal affecting antepartum care of mother   . Underweight 06/05/2014  . GERD (gastroesophageal reflux disease) 06/05/2014  . History of anemia 06/05/2014  . Anxiety state 06/05/2014  . Abnormal Pap smear of cervix 06/05/2014    Past Surgical History:  Procedure Laterality Date  . DILATION AND CURETTAGE OF UTERUS  2015   Rupture of membranes     OB History    Gravida  2   Para  1   Term  1   Preterm  0   AB  1   Living  1     SAB      TAB      Ectopic      Multiple  0   Live Births  1        Obstetric Comments  Premature rupture at 5 months.         Home Medications    Prior to Admission medications   Medication Sig Start Date End Date Taking? Authorizing Provider  ibuprofen (ADVIL) 200 MG tablet Take 600 mg by mouth every 6 (six) hours as needed for fever, headache or moderate pain.   Yes [provider]  ibuprofen (ADVIL,MOTRIN) 600 MG tablet Take 1 tablet (600 mg total) by mouth every 6 (six) hours. Patient not taking: Reported on 03/03/2019 04/26/18   Hartsog, Marry Guan, CNM  naproxen (NAPROSYN) 500 MG tablet Take 1 tablet (500 mg total) by mouth 2 (two) times daily with a meal. 03/03/19   Ander Wamser, PA-C  NIFEdipine (ADALAT CC) 30 MG 24 hr tablet Take 1 tablet (30 mg total) by mouth daily. Patient not taking: Reported on 03/03/2019 05/09/18 06/08/18  Page, Faylene Million, CNM    Family History No family history on file.  Social History Social History   Tobacco Use  . Smoking status: Never Smoker  . Smokeless tobacco: Never Used  Substance Use Topics  . Alcohol use: No  Comment: occas.  . Drug use: No     Allergies   Patient has no known allergies.   Review of Systems Review of Systems  Musculoskeletal: Positive for arthralgias and joint swelling.     Physical Exam Updated Vital Signs BP (!) 133/102   Pulse 81   Temp 98.6 F (37 C)   Resp 17   LMP 02/15/2019   SpO2 97%   Physical Exam Vitals signs and nursing note reviewed.  Constitutional:      General: She is not in acute distress.    Appearance: She is well-developed.  HENT:     Head: Normocephalic and atraumatic.  Neck:     Musculoskeletal: Normal range of motion.  Pulmonary:     Effort: Pulmonary effort is normal.  Abdominal:     General: There is no distension.  Musculoskeletal:        General: Swelling and tenderness present.     Comments: Mild swelling of the left knee.  Tenderness palpation of the anterior knee, mostly over the patellar  tendon.  Tenderness palpation over medial and lateral joint lines.  Patient unable/unwilling to range knee or do SLR due to pain.  No erythema or warmth.  Pedal pulses intact bilaterally.  No tenderness palpation of the calf or distal leg.  Skin:    General: Skin is warm.     Capillary Refill: Capillary refill takes less than 2 seconds.     Findings: No rash.  Neurological:     Mental Status: She is alert and oriented to person, place, and time.      ED Treatments / Results  Labs (all labs ordered are listed, but only abnormal results are displayed) Labs Reviewed - No data to display  EKG None  Radiology Dg Knee Complete 4 Views Left  Result Date: 03/03/2019 CLINICAL DATA:  Unable to straighten the knee completely status post fall EXAM: LEFT KNEE - COMPLETE 4+ VIEW COMPARISON:  None. FINDINGS: No acute fracture or dislocation. Small joint effusion. No aggressive osseous lesion. No soft tissue abnormality. IMPRESSION: No acute osseous injury of the left knee. Electronically Signed   By: Kathreen Devoid   On: 03/03/2019 08:21    Procedures Procedures (including critical care time)  Medications Ordered in ED Medications - No data to display   Initial Impression / Assessment and Plan / ED Course  I have reviewed the triage vital signs and the nursing notes.  Pertinent labs & imaging results that were available during my care of the patient were reviewed by me and considered in my medical decision making (see chart for details).        Patient presenting for evaluation of knee pain after fall yesterday.  Physical examination, she is neurovascular intact.  X-rays viewed interpreted by me, no fracture dislocation.  Significant tenderness palpation of the patella tendon, ?high riding patella on the x-ray.  Pt also with knee effusion. Patient is either unable to or unwilling to range/move knee. Consider patellar tendon injury.  Also consider ligamentous or cartilagenous injury.  Will  treat with knee immobilizer, crutches, and rice therapy.  Encourage follow-up with orthopedics in 1 week if symptoms are not improving.  At this time, patient appears safe for discharge.  Return precautions given.  Patient states she understands agrees plan.   Final Clinical Impressions(s) / ED Diagnoses   Final diagnoses:  Acute pain of left knee  Effusion of left knee    ED Discharge Orders  Ordered    naproxen (NAPROSYN) 500 MG tablet  2 times daily with meals     03/03/19 0844           Eliyanna Ault, PA-C 03/03/19 0905    Melene PlanFloyd, Dan, DO 03/03/19 [phone]

## 2019-03-03 NOTE — ED Triage Notes (Signed)
Pt reports fell down stairs yesterday. Having left knee pains.

## 2019-03-03 NOTE — ED Notes (Signed)
ED Provider at bedside. 

## 2019-12-27 ENCOUNTER — Inpatient Hospital Stay (HOSPITAL_COMMUNITY)
Admission: AD | Admit: 2019-12-27 | Discharge: 2019-12-28 | Disposition: A | Discharge status: Home / Self Care | Payer: Medicaid Other | Attending: Obstetrics and Gynecology | Admitting: Obstetrics and Gynecology

## 2019-12-27 DIAGNOSIS — B373 Candidiasis of vulva and vagina: Secondary | ICD-10-CM | POA: Diagnosis not present

## 2019-12-27 DIAGNOSIS — Z3491 Encounter for supervision of normal pregnancy, unspecified, first trimester: Secondary | ICD-10-CM

## 2019-12-27 DIAGNOSIS — M79641 Pain in right hand: Secondary | ICD-10-CM | POA: Diagnosis not present

## 2019-12-27 DIAGNOSIS — B3731 Acute candidiasis of vulva and vagina: Secondary | ICD-10-CM

## 2019-12-27 DIAGNOSIS — O208 Other hemorrhage in early pregnancy: Secondary | ICD-10-CM | POA: Insufficient documentation

## 2019-12-27 DIAGNOSIS — Y929 Unspecified place or not applicable: Secondary | ICD-10-CM | POA: Insufficient documentation

## 2019-12-27 DIAGNOSIS — O468X1 Other antepartum hemorrhage, first trimester: Secondary | ICD-10-CM

## 2019-12-27 DIAGNOSIS — O26891 Other specified pregnancy related conditions, first trimester: Secondary | ICD-10-CM | POA: Insufficient documentation

## 2019-12-27 DIAGNOSIS — Z3A09 9 weeks gestation of pregnancy: Secondary | ICD-10-CM | POA: Diagnosis not present

## 2019-12-27 DIAGNOSIS — Y939 Activity, unspecified: Secondary | ICD-10-CM | POA: Diagnosis not present

## 2019-12-27 DIAGNOSIS — S6991XA Unspecified injury of right wrist, hand and finger(s), initial encounter: Secondary | ICD-10-CM | POA: Diagnosis not present

## 2019-12-27 DIAGNOSIS — R102 Pelvic and perineal pain: Secondary | ICD-10-CM | POA: Insufficient documentation

## 2019-12-27 DIAGNOSIS — O98811 Other maternal infectious and parasitic diseases complicating pregnancy, first trimester: Secondary | ICD-10-CM | POA: Diagnosis not present

## 2019-12-27 DIAGNOSIS — R109 Unspecified abdominal pain: Secondary | ICD-10-CM | POA: Diagnosis not present

## 2019-12-27 DIAGNOSIS — O418X10 Other specified disorders of amniotic fluid and membranes, first trimester, not applicable or unspecified: Secondary | ICD-10-CM | POA: Insufficient documentation

## 2019-12-28 ENCOUNTER — Inpatient Hospital Stay (HOSPITAL_COMMUNITY): Payer: Medicaid Other

## 2019-12-28 ENCOUNTER — Encounter (HOSPITAL_COMMUNITY): Payer: Self-pay

## 2019-12-28 DIAGNOSIS — O468X1 Other antepartum hemorrhage, first trimester: Secondary | ICD-10-CM

## 2019-12-28 DIAGNOSIS — Z3A09 9 weeks gestation of pregnancy: Secondary | ICD-10-CM

## 2019-12-28 LAB — URINALYSIS, ROUTINE W REFLEX MICROSCOPIC
Bilirubin Urine: NEGATIVE
Glucose, UA: NEGATIVE mg/dL
Hgb urine dipstick: NEGATIVE
Ketones, ur: NEGATIVE mg/dL
Leukocytes,Ua: NEGATIVE
Nitrite: NEGATIVE
Protein, ur: 30 mg/dL — AB
Specific Gravity, Urine: 1.016 (ref 1.005–1.030)
pH: 5 (ref 5.0–8.0)

## 2019-12-28 LAB — WET PREP, GENITAL
Clue Cells Wet Prep HPF POC: NONE SEEN
Sperm: NONE SEEN
Trich, Wet Prep: NONE SEEN

## 2019-12-28 LAB — GC/CHLAMYDIA PROBE AMP (~~LOC~~) NOT AT ARMC
Chlamydia: NEGATIVE
Comment: NEGATIVE
Comment: NORMAL
Neisseria Gonorrhea: NEGATIVE

## 2019-12-28 LAB — POCT PREGNANCY, URINE: Preg Test, Ur: POSITIVE — AB

## 2019-12-28 MED ORDER — TERCONAZOLE 0.4 % VA CREA: 1.0000 | TOPICAL_CREAM | Freq: Every day | VAGINAL | 0 refills | Status: DC

## 2019-12-28 MED ORDER — TRAMADOL HCL 50 MG PO TABS
50.0000 mg | ORAL_TABLET | Freq: Once | ORAL | Status: AC
  Administered 2019-12-28: 50 mg via ORAL
  Filled 2019-12-28: qty 1

## 2019-12-28 MED ORDER — TRAMADOL HCL 50 MG PO TABS: 50.0000 mg | ORAL_TABLET | Freq: Four times a day (QID) | ORAL | 0 refills | Status: AC | PRN

## 2019-12-28 MED ORDER — ACETAMINOPHEN 500 MG PO TABS
1000.0000 mg | ORAL_TABLET | Freq: Once | ORAL | Status: AC
  Administered 2019-12-28: 1000 mg via ORAL
  Filled 2019-12-28: qty 2

## 2019-12-28 NOTE — Discharge Instructions (Signed)
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
## 2020-01-28 DIAGNOSIS — B977 Papillomavirus as the cause of diseases classified elsewhere: Secondary | ICD-10-CM | POA: Insufficient documentation

## 2020-01-28 DIAGNOSIS — Z8759 Personal history of other complications of pregnancy, childbirth and the puerperium: Secondary | ICD-10-CM | POA: Insufficient documentation

## 2020-02-02 LAB — OB RESULTS CONSOLE HEPATITIS B SURFACE ANTIGEN: Hepatitis B Surface Ag: NEGATIVE

## 2020-02-02 LAB — OB RESULTS CONSOLE RUBELLA ANTIBODY, IGM: Rubella: IMMUNE

## 2020-03-24 DIAGNOSIS — R87612 Low grade squamous intraepithelial lesion on cytologic smear of cervix (LGSIL): Secondary | ICD-10-CM | POA: Insufficient documentation

## 2020-03-24 DIAGNOSIS — Z148 Genetic carrier of other disease: Secondary | ICD-10-CM | POA: Insufficient documentation

## 2020-05-11 LAB — OB RESULTS CONSOLE HIV ANTIBODY (ROUTINE TESTING): HIV: NONREACTIVE

## 2020-05-12 DIAGNOSIS — O99019 Anemia complicating pregnancy, unspecified trimester: Secondary | ICD-10-CM | POA: Insufficient documentation

## 2020-05-22 LAB — OB RESULTS CONSOLE GC/CHLAMYDIA: Chlamydia: NEGATIVE

## 2020-06-01 LAB — OB RESULTS CONSOLE GC/CHLAMYDIA: Gonorrhea: NEGATIVE

## 2020-06-04 NOTE — L&D Delivery Note (Signed)
Delivery Note Labor onset: 07/29/2020  Labor Onset Time: 2130 Complete dilation at 8:05 AM  Onset of pushing at 0810 FHR second stage Cat 2 Analgesia/Anesthesia intrapartum: Epidural  Guided pushing with force of contraction. Delivery of a viable female at 38. Fetal head delivered in LOP position.  Nuchal cord: x1.  Infant placed on maternal abd, dried, and tactile stim. Good tone, HR, and weak cry at 1 min. Decrease in HR and tone noted @ 1.5 min. Cord clamped and cut by Birdie Riddle, father. Infant taken to radiant warmer for secondary apnea. See infant chart for details of neonatal resuscitation.   FOB and paternal aunt present for birth.  Cord blood sample collected: Yes Arterial cord blood sample collected: No  Placenta delivered Tomasa Blase, intact, with 3 VC.  Placenta to pathology for suspected aruption. Uterine atony, bleeding brisk Atony resolved with Cytotec 600 mcg 400 mcg buccal and TXA 1G IV  2nd degree laceration identified.  Anesthesia: Epidural Repair: 4-0 Vicryl CT QBL/EBL (mL): 500 Complications: PPH APGAR: APGAR (1 MIN): 2   APGAR (5 MINS):  8 APGAR (10 MINS):   Mom to postpartum.  Baby to Couplet care / Skin to Skin.  Addendum Notified by RN of additional 211 mL blood loss prior to transfer to PP. Fundus firm, midline, small lochia. Urine output 500 mL. Will start methergine 0.2 mg PO QID x6 doses.   Roma Schanz MSN, CNM 07/30/2020, 10:54 AM

## 2020-06-14 ENCOUNTER — Ambulatory Visit
Admission: EM | Admit: 2020-06-14 | Discharge: 2020-06-14 | Discharge status: Against Medical Advice | Payer: Medicaid Other

## 2020-06-14 ENCOUNTER — Other Ambulatory Visit: Payer: Self-pay

## 2020-06-14 NOTE — ED Triage Notes (Signed)
Pt called x3 without response. °

## 2020-07-08 LAB — OB RESULTS CONSOLE GBS: GBS: POSITIVE

## 2020-07-30 ENCOUNTER — Inpatient Hospital Stay (HOSPITAL_COMMUNITY): Payer: Medicaid Other | Admitting: Anesthesiology

## 2020-07-30 ENCOUNTER — Other Ambulatory Visit: Payer: Self-pay

## 2020-07-30 ENCOUNTER — Inpatient Hospital Stay (HOSPITAL_COMMUNITY)
Admission: AD | Admit: 2020-07-30 | Discharge: 2020-07-31 | DRG: 806 | Disposition: A | Discharge status: Home / Self Care | Payer: Medicaid Other | Attending: Obstetrics & Gynecology | Admitting: Obstetrics & Gynecology

## 2020-07-30 ENCOUNTER — Encounter (HOSPITAL_COMMUNITY): Payer: Self-pay | Admitting: Obstetrics and Gynecology

## 2020-07-30 DIAGNOSIS — D62 Acute posthemorrhagic anemia: Secondary | ICD-10-CM | POA: Diagnosis not present

## 2020-07-30 DIAGNOSIS — Z3A39 39 weeks gestation of pregnancy: Secondary | ICD-10-CM | POA: Diagnosis not present

## 2020-07-30 DIAGNOSIS — O99824 Streptococcus B carrier state complicating childbirth: Secondary | ICD-10-CM | POA: Diagnosis present

## 2020-07-30 DIAGNOSIS — A749 Chlamydial infection, unspecified: Secondary | ICD-10-CM | POA: Diagnosis present

## 2020-07-30 DIAGNOSIS — O26893 Other specified pregnancy related conditions, third trimester: Secondary | ICD-10-CM | POA: Diagnosis present

## 2020-07-30 DIAGNOSIS — Z20822 Contact with and (suspected) exposure to covid-19: Secondary | ICD-10-CM | POA: Diagnosis present

## 2020-07-30 DIAGNOSIS — O9081 Anemia of the puerperium: Secondary | ICD-10-CM | POA: Diagnosis not present

## 2020-07-30 LAB — CBC
HCT: 25.9 % — ABNORMAL LOW (ref 36.0–46.0)
Hemoglobin: 7.9 g/dL — ABNORMAL LOW (ref 12.0–15.0)
MCH: 25 pg — ABNORMAL LOW (ref 26.0–34.0)
MCHC: 30.5 g/dL (ref 30.0–36.0)
MCV: 82 fL (ref 80.0–100.0)
Platelets: 213 10*3/uL (ref 150–400)
RBC: 3.16 MIL/uL — ABNORMAL LOW (ref 3.87–5.11)
RDW: 18.4 % — ABNORMAL HIGH (ref 11.5–15.5)
WBC: 8.1 10*3/uL (ref 4.0–10.5)
nRBC: 0.2 % (ref 0.0–0.2)

## 2020-07-30 LAB — RESP PANEL BY RT-PCR (FLU A&B, COVID) ARPGX2
Influenza A by PCR: NEGATIVE
Influenza B by PCR: NEGATIVE
SARS Coronavirus 2 by RT PCR: NEGATIVE

## 2020-07-30 LAB — TYPE AND SCREEN
ABO/RH(D): B POS
Antibody Screen: NEGATIVE

## 2020-07-30 LAB — RPR: RPR Ser Ql: NONREACTIVE

## 2020-07-30 MED ORDER — PHENYLEPHRINE 40 MCG/ML (10ML) SYRINGE FOR IV PUSH (FOR BLOOD PRESSURE SUPPORT): 80.0000 ug | PREFILLED_SYRINGE | INTRAVENOUS | Status: DC | PRN

## 2020-07-30 MED ORDER — MISOPROSTOL 200 MCG PO TABS
400.0000 ug | ORAL_TABLET | Freq: Once | ORAL | Status: AC
  Administered 2020-07-30: 400 ug via BUCCAL

## 2020-07-30 MED ORDER — ACETAMINOPHEN 325 MG PO TABS: 650.0000 mg | ORAL_TABLET | ORAL | Status: DC | PRN

## 2020-07-30 MED ORDER — DIBUCAINE (PERIANAL) 1 % EX OINT: 1.0000 "application " | TOPICAL_OINTMENT | CUTANEOUS | Status: DC | PRN

## 2020-07-30 MED ORDER — COCONUT OIL OIL: 1.0000 "application " | TOPICAL_OIL | Status: DC | PRN

## 2020-07-30 MED ORDER — ONDANSETRON HCL 4 MG/2ML IJ SOLN: 4.0000 mg | INTRAMUSCULAR | Status: DC | PRN

## 2020-07-30 MED ORDER — LACTATED RINGERS IV SOLN: 500.0000 mL | Freq: Once | INTRAVENOUS | Status: DC

## 2020-07-30 MED ORDER — ACETAMINOPHEN 325 MG PO TABS
650.0000 mg | ORAL_TABLET | ORAL | Status: DC | PRN
  Administered 2020-07-31 (×2): 650 mg via ORAL
  Filled 2020-07-30 (×2): qty 2

## 2020-07-30 MED ORDER — PRENATAL MULTIVITAMIN CH
1.0000 | ORAL_TABLET | Freq: Every day | ORAL | Status: DC
  Administered 2020-07-30: 1 via ORAL
  Filled 2020-07-30 (×2): qty 1

## 2020-07-30 MED ORDER — LIDOCAINE-EPINEPHRINE (PF) 2 %-1:200000 IJ SOLN
INTRAMUSCULAR | Status: DC | PRN
  Administered 2020-07-30: 4 mL via EPIDURAL

## 2020-07-30 MED ORDER — TRANEXAMIC ACID-NACL 1000-0.7 MG/100ML-% IV SOLN
1000.0000 mg | INTRAVENOUS | Status: AC
  Administered 2020-07-30: 1000 mg via INTRAVENOUS

## 2020-07-30 MED ORDER — TETANUS-DIPHTH-ACELL PERTUSSIS 5-2.5-18.5 LF-MCG/0.5 IM SUSY: 0.5000 mL | PREFILLED_SYRINGE | Freq: Once | INTRAMUSCULAR | Status: DC

## 2020-07-30 MED ORDER — SODIUM CHLORIDE 0.9 % IV SOLN
2.0000 g | Freq: Four times a day (QID) | INTRAVENOUS | Status: DC
  Administered 2020-07-30 (×2): 2 g via INTRAVENOUS
  Filled 2020-07-30 (×2): qty 2000

## 2020-07-30 MED ORDER — MISOPROSTOL 200 MCG PO TABS
600.0000 ug | ORAL_TABLET | Freq: Once | ORAL | Status: AC
  Administered 2020-07-30: 600 ug via RECTAL

## 2020-07-30 MED ORDER — ACETAMINOPHEN 500 MG PO TABS
1000.0000 mg | ORAL_TABLET | Freq: Once | ORAL | Status: AC
  Administered 2020-07-31: 1000 mg via ORAL
  Filled 2020-07-30 (×2): qty 2

## 2020-07-30 MED ORDER — DIPHENHYDRAMINE HCL 50 MG/ML IJ SOLN: 12.5000 mg | INTRAMUSCULAR | Status: DC | PRN

## 2020-07-30 MED ORDER — EPHEDRINE 5 MG/ML INJ: 10.0000 mg | INTRAVENOUS | Status: DC | PRN

## 2020-07-30 MED ORDER — SENNOSIDES-DOCUSATE SODIUM 8.6-50 MG PO TABS
2.0000 | ORAL_TABLET | ORAL | Status: DC
  Administered 2020-07-30 – 2020-07-31 (×2): 2 via ORAL
  Filled 2020-07-30 (×2): qty 2

## 2020-07-30 MED ORDER — DIPHENHYDRAMINE HCL 25 MG PO CAPS: 25.0000 mg | ORAL_CAPSULE | Freq: Four times a day (QID) | ORAL | Status: DC | PRN

## 2020-07-30 MED ORDER — FLEET ENEMA 7-19 GM/118ML RE ENEM
1.0000 | ENEMA | RECTAL | Status: DC | PRN
Start: 2020-07-30 — End: 2020-07-30

## 2020-07-30 MED ORDER — LIDOCAINE HCL (PF) 1 % IJ SOLN: 30.0000 mL | INTRAMUSCULAR | Status: DC | PRN

## 2020-07-30 MED ORDER — OXYCODONE-ACETAMINOPHEN 5-325 MG PO TABS: 2.0000 | ORAL_TABLET | ORAL | Status: DC | PRN

## 2020-07-30 MED ORDER — FENTANYL CITRATE (PF) 100 MCG/2ML IJ SOLN
100.0000 ug | INTRAMUSCULAR | Status: DC | PRN
  Administered 2020-07-30: 100 ug via INTRAVENOUS
  Filled 2020-07-30: qty 2

## 2020-07-30 MED ORDER — ONDANSETRON HCL 4 MG/2ML IJ SOLN: 4.0000 mg | Freq: Four times a day (QID) | INTRAMUSCULAR | Status: DC | PRN

## 2020-07-30 MED ORDER — ONDANSETRON HCL 4 MG PO TABS
4.0000 mg | ORAL_TABLET | ORAL | Status: DC | PRN
Start: 2020-07-30 — End: 2020-07-31

## 2020-07-30 MED ORDER — FENTANYL-BUPIVACAINE-NACL 0.5-0.125-0.9 MG/250ML-% EP SOLN
12.0000 mL/h | EPIDURAL | Status: DC | PRN
Start: 2020-07-30 — End: 2020-07-30
  Administered 2020-07-30: 12 mL/h via EPIDURAL
  Filled 2020-07-30: qty 250

## 2020-07-30 MED ORDER — BENZOCAINE-MENTHOL 20-0.5 % EX AERO
1.0000 "application " | INHALATION_SPRAY | CUTANEOUS | Status: DC | PRN
  Administered 2020-07-30: 1 via TOPICAL
  Filled 2020-07-30: qty 56

## 2020-07-30 MED ORDER — IBUPROFEN 600 MG PO TABS
600.0000 mg | ORAL_TABLET | Freq: Four times a day (QID) | ORAL | Status: DC
  Administered 2020-07-30 – 2020-07-31 (×6): 600 mg via ORAL
  Filled 2020-07-30 (×6): qty 1

## 2020-07-30 MED ORDER — OXYCODONE-ACETAMINOPHEN 5-325 MG PO TABS: 1.0000 | ORAL_TABLET | ORAL | Status: DC | PRN

## 2020-07-30 MED ORDER — SIMETHICONE 80 MG PO CHEW: 80.0000 mg | CHEWABLE_TABLET | ORAL | Status: DC | PRN

## 2020-07-30 MED ORDER — LACTATED RINGERS IV SOLN: 500.0000 mL | INTRAVENOUS | Status: DC | PRN

## 2020-07-30 MED ORDER — METHYLERGONOVINE MALEATE 0.2 MG PO TABS
0.2000 mg | ORAL_TABLET | ORAL | Status: AC
  Administered 2020-07-30 – 2020-07-31 (×6): 0.2 mg via ORAL
  Filled 2020-07-30 (×6): qty 1

## 2020-07-30 MED ORDER — LACTATED RINGERS IV SOLN: INTRAVENOUS | Status: DC

## 2020-07-30 MED ORDER — OXYTOCIN BOLUS FROM INFUSION
333.0000 mL | Freq: Once | INTRAVENOUS | Status: AC
  Administered 2020-07-30: 333 mL via INTRAVENOUS

## 2020-07-30 MED ORDER — SOD CITRATE-CITRIC ACID 500-334 MG/5ML PO SOLN
30.0000 mL | ORAL | Status: DC | PRN
Start: 2020-07-30 — End: 2020-07-30

## 2020-07-30 MED ORDER — OXYTOCIN-SODIUM CHLORIDE 30-0.9 UT/500ML-% IV SOLN
2.5000 [IU]/h | INTRAVENOUS | Status: DC
  Filled 2020-07-30: qty 500

## 2020-07-30 MED ORDER — TRANEXAMIC ACID-NACL 1000-0.7 MG/100ML-% IV SOLN
INTRAVENOUS | Status: AC
  Filled 2020-07-30: qty 100

## 2020-07-30 MED ORDER — PHENYLEPHRINE 40 MCG/ML (10ML) SYRINGE FOR IV PUSH (FOR BLOOD PRESSURE SUPPORT)
80.0000 ug | PREFILLED_SYRINGE | INTRAVENOUS | Status: DC | PRN
  Filled 2020-07-30: qty 10

## 2020-07-30 MED ORDER — WITCH HAZEL-GLYCERIN EX PADS
1.0000 "application " | MEDICATED_PAD | CUTANEOUS | Status: DC | PRN
  Administered 2020-07-30: 1 via TOPICAL

## 2020-07-30 NOTE — MAU Note (Signed)
PT SAYS UC STRONG SINCE 930PM-   PNC WITH [facility]- VE YESTERDAY -  3 CM.  DENIES HSV AND  MRSA. GBS- POSITIVE

## 2020-07-30 NOTE — Progress Notes (Signed)
Shaking continues, improved. Will continue to monitor.

## 2020-07-30 NOTE — Progress Notes (Signed)
Shaking continues. Will continue to monitor.

## 2020-07-30 NOTE — Anesthesia Procedure Notes (Signed)
Epidural Patient location during procedure: OB Start time: 07/30/2020 2:30 AM End time: 07/30/2020 2:40 AM  Staffing Anesthesiologist: Elmer Picker, MD Performed: anesthesiologist   Preanesthetic Checklist Completed: patient identified, IV checked, risks and benefits discussed, monitors and equipment checked, pre-op evaluation and timeout performed  Epidural Patient position: sitting Prep: DuraPrep and site prepped and draped Patient monitoring: continuous pulse ox, blood pressure, heart rate and cardiac monitor Approach: midline Location: L3-L4 Injection technique: LOR air  Needle:  Needle type: Tuohy  Needle gauge: 17 G Needle length: 9 cm Needle insertion depth: 5 cm Catheter type: closed end flexible Catheter size: 19 Gauge Catheter at skin depth: 10 cm Test dose: negative  Assessment Sensory level: T8 Events: blood not aspirated, injection not painful, no injection resistance, no paresthesia and negative IV test  Additional Notes Patient identified. Risks/Benefits/Options discussed with patient including but not limited to bleeding, infection, nerve damage, paralysis, failed block, incomplete pain control, headache, blood pressure changes, nausea, vomiting, reactions to medication both or allergic, itching and postpartum back pain. Confirmed with bedside nurse the patient's most recent platelet count. Confirmed with patient that they are not currently taking any anticoagulation, have any bleeding history or any family history of bleeding disorders. Patient expressed understanding and wished to proceed. All questions were answered. Sterile technique was used throughout the entire procedure. Please see nursing notes for vital signs. Test dose was given through epidural catheter and negative prior to continuing to dose epidural or start infusion. Warning signs of high block given to the patient including shortness of breath, tingling/numbness in hands, complete motor block,  or any concerning symptoms with instructions to call for help. Patient was given instructions on fall risk and not to get out of bed. All questions and concerns addressed with instructions to call with any issues or inadequate analgesia.  Reason for block:procedure for pain

## 2020-07-30 NOTE — Anesthesia Preprocedure Evaluation (Signed)
Anesthesia Evaluation  Patient identified by MRN, date of birth, ID band Patient awake    Reviewed: Allergy & Precautions, NPO status , Patient's Chart, lab work & pertinent test results  Airway Mallampati: II  TM Distance: >3 FB Neck ROM: Full    Dental no notable dental hx.    Pulmonary neg pulmonary ROS,    Pulmonary exam normal breath sounds clear to auscultation       Cardiovascular negative cardio ROS Normal cardiovascular exam Rhythm:Regular Rate:Normal     Neuro/Psych negative neurological ROS  negative psych ROS   GI/Hepatic Neg liver ROS, GERD  ,  Endo/Other  negative endocrine ROS  Renal/GU negative Renal ROS  negative genitourinary   Musculoskeletal negative musculoskeletal ROS (+)   Abdominal   Peds  Hematology  (+) Blood dyscrasia (Hgb 7.9), anemia ,   Anesthesia Other Findings   Reproductive/Obstetrics (+) Pregnancy                             Anesthesia Physical Anesthesia Plan  ASA: II  Anesthesia Plan: Epidural   Post-op Pain Management:    Induction:   PONV Risk Score and Plan: Treatment may vary due to age or medical condition  Airway Management Planned: Natural Airway  Additional Equipment:   Intra-op Plan:   Post-operative Plan:   Informed Consent: I have reviewed the patients History and Physical, chart, labs and discussed the procedure including the risks, benefits and alternatives for the proposed anesthesia with the patient or authorized representative who has indicated his/her understanding and acceptance.       Plan Discussed with: Anesthesiologist  Anesthesia Plan Comments: (Patient identified. Risks, benefits, options discussed with patient including but not limited to bleeding, infection, nerve damage, paralysis, failed block, incomplete pain control, headache, blood pressure changes, nausea, vomiting, reactions to medication, itching, and  post partum back pain. Confirmed with bedside nurse the patient's most recent platelet count. Confirmed with the patient that they are not taking any anticoagulation, have any bleeding history or any family history of bleeding disorders. Patient expressed understanding and wishes to proceed. All questions were answered. )        Anesthesia Quick Evaluation

## 2020-07-30 NOTE — Progress Notes (Signed)
Shaking much improved.

## 2020-07-30 NOTE — Social Work (Signed)
MOB was referred for history of depression.   * Referral screened out by Clinical Social Worker because none of the following criteria appear to apply:  ~ History of anxiety/depression during this pregnancy, or of post-partum depression following prior delivery. ~ Diagnosis of anxiety and/or depression within last 3 years. OR * MOB's symptoms currently being treated with medication and/or therapy.  Please contact the Clinical Social Worker if needs arise, by MOB request, or if MOB scores greater than 9/yes to question 10 on Edinburgh Postpartum Depression Screen.  Vi Whitesel, LCSWA Clinical Social Work [facility]  [phone] 

## 2020-07-30 NOTE — Progress Notes (Signed)
Robin Lloyd is a 30 y.o. G3P1011 at [redacted]w[redacted]d by  admitted for active labor. Subjective:  Pt resting comfortably with epidural in place. SVE - bulging BOW noted. AROM R/B/A discussed. AROM complete. Clear fluid noted. 7/100/0. CAT 1.    Objective: Vitals:   07/30/20 0430 07/30/20 0500 07/30/20 0530 07/30/20 0600  BP: 119/73 117/68 121/67 114/67  Pulse: 82 96 84 94  Resp: 16 16 16 16   Temp:      TempSrc:      Weight:      Height:        No intake/output data recorded.    FHT:  FHR: 120 bpm, variability: moderate,  accelerations:  Present,  decelerations:  Absent UC:   regular, every 2-4 minutes SVE:   7/100/0  Labs:   Recent Labs    07/30/20 0113  WBC 8.1  HGB 7.9*  HCT 25.9*  PLT 213    Assessment / Plan: 30 y.o. AGP@ [redacted]w[redacted]d admitted for active labor.  Labor: Progressing normally Preeclampsia:  no signs or symptoms of toxicity Fetal Wellbeing:  Category I Pain Control:  Epidural I/D:  GBS positive. Ampicillin x 2 Anticipated MOD:  NSVD   [redacted]w[redacted]d MSN, CNM 07/30/2020, 6:17 AM

## 2020-07-30 NOTE — Progress Notes (Signed)
Pt shivering uncontrollably. BP taken on upper arm twice with inaccurate readings. Pt covered in warm blankets and BP cuff moved to right lower leg. Will continue to monitor.

## 2020-07-30 NOTE — H&P (Signed)
OB ADMISSION/ HISTORY & PHYSICAL:  Admission Date: 07/30/2020 12:22 AM  Admit Diagnosis: Normal labor  Robin Lloyd is a 30 y.o. female G3P1011 [redacted]w[redacted]d presenting for ctxs. Endorses active FM, denies LOF and vaginal bleeding. Ctx began @ 2130  History of current pregnancy: G3P1011   Primary OB Provider: [facility] Patient entered care with [facility] at 13.5 wks.   EDC 07/31/20 by 9.1 wk U/S Anatomy scan:  22.2 wks, complete w/ anterior placenta.    Significant prenatal events:  Patient Active Problem List   Diagnosis Date Noted  . Normal labor 07/30/2020  . Preeclampsia 05/07/2018  . Chlamydia   . Chlamydia infection affecting pregnancy--dx 11/24, no treatment until 12/14, still positive 1/2--treated 1/2 in MAU, still positive on 06/05/14 test. 06/08/2014  . Echogenic focus of bowel of fetal affecting antepartum care of mother   . Underweight 06/05/2014  . GERD (gastroesophageal reflux disease) 06/05/2014  . History of anemia 06/05/2014  . Anxiety state 06/05/2014  . Abnormal Pap smear of cervix 06/05/2014  PP Eclampsia  Prenatal Labs: ABO, Rh: --/--/PENDING (02/26 0100)B positive Antibody: PENDING (02/26 0100)negative Rubella:   Immune RPR:   NR HBsAg:   Negative  HIV:   Negative GTT: 91 GBS:   positive GC/CHL: negative/negative Genetics: Panorama low risk, SMA carrier (No partner testing) Tdap/influenza vaccines: UTD/Declines   OB History  Gravida Para Term Preterm AB Living  3 1 1  0 1 1  SAB IAB Ectopic Multiple Live Births        0 1    # Outcome Date GA Lbr Len/2nd Weight Sex Delivery Anes PTL Lv  3 Current           2 Term 04/24/18 [redacted]w[redacted]d 03:34 / 00:31 3646 g F Vag-Spont EPI  LIV  1 AB 06/09/14 [redacted]w[redacted]d    SAB   FD     Complications: Preterm premature rupture of membranes (PPROM) with unknown onset of labor    Obstetric Comments  Premature rupture at 5 months.    Medical / Surgical History: Past medical history:  Past Medical History:  Diagnosis Date  . Anxiety    . Chlamydia   . GERD (gastroesophageal reflux disease)   . Ovarian cyst   . Vaginal Pap smear, abnormal     Past surgical history:  Past Surgical History:  Procedure Laterality Date  . DILATION AND CURETTAGE OF UTERUS  2015   Rupture of membranes   Family History: History reviewed. No pertinent family history.  Social History:  reports that she has never smoked. She has never used smokeless tobacco. She reports that she does not drink alcohol and does not use drugs.  Allergies: Patient has no known allergies.   Current Medications at time of admission:  Prior to Admission medications   Medication Sig Start Date End Date Taking? Authorizing Provider  terconazole (TERAZOL 7) 0.4 % vaginal cream Place 1 applicator vaginally at bedtime. Use for seven days 12/28/19   12/30/19, NP    Review of Systems: Constitutional: Negative   HENT: Negative   Eyes: Negative   Respiratory: Negative   Cardiovascular: Negative   Gastrointestinal: Negative  Genitourinary: negative for bloody show, negative for LOF   Musculoskeletal: Negative   Skin: Negative   Neurological: Negative   Endo/Heme/Allergies: Negative   Psychiatric/Behavioral: Negative    Physical Exam: VS: Blood pressure 123/69, pulse 83, temperature 98.9 F (37.2 C), temperature source Oral, resp. rate 16, height 5\' 2"  (1.575 m), weight 77.8 kg, last menstrual period 10/05/2019,  unknown if currently breastfeeding. AAO x3, no signs of distress Cardiovascular: RRR Respiratory: Lung fields clear to ausculation GU/GI: Abdomen gravid, non-tender, non-distended, active FM, vertex Extremities: negative edema, negative for pain, tenderness, and cords  Cervical exam:Dilation: 5.5 Effacement (%): 70 Station: -2 Exam by:: [name], RN FHR: baseline rate 130 / variability moderate / accelerations present / negative decelerations TOCO:   Prenatal Transfer Tool  Maternal Diabetes: No Genetic Screening: Normal  panorama low risk, SMA carrier (no partner testing) Maternal Ultrasounds/Referrals: Normal Fetal Ultrasounds or other Referrals:  None Maternal Substance Abuse:  No Significant Maternal Medications:  None Significant Maternal Lab Results: Group B Strep positive    Assessment: 30 y.o. [id number] [redacted]w[redacted]d admitted for Active Labor.   Active stage of labor FHR category 1 GBS positive Pain management plan: epidural   Plan:  Admit to L&D Routine admission orders AROM after epidural placement Epidural PRN Ampicillin for GBS prophylaxis d/t advanced dilatation, multigravida Will notify Dr Su Hilt notified of admission and plan of care  [name] MSN, CNM 07/30/2020 1:37 AM

## 2020-07-31 LAB — CBC
HCT: 31.9 % — ABNORMAL LOW (ref 36.0–46.0)
Hemoglobin: 9.6 g/dL — ABNORMAL LOW (ref 12.0–15.0)
MCH: 24.2 pg — ABNORMAL LOW (ref 26.0–34.0)
MCHC: 30.1 g/dL (ref 30.0–36.0)
MCV: 80.6 fL (ref 80.0–100.0)
Platelets: 193 10*3/uL (ref 150–400)
RBC: 3.96 MIL/uL (ref 3.87–5.11)
RDW: 18.3 % — ABNORMAL HIGH (ref 11.5–15.5)
WBC: 10.8 10*3/uL — ABNORMAL HIGH (ref 4.0–10.5)
nRBC: 0 % (ref 0.0–0.2)

## 2020-07-31 MED ORDER — ACETAMINOPHEN 500 MG PO TABS: 1000.0000 mg | ORAL_TABLET | Freq: Once | ORAL | 0 refills | Status: AC

## 2020-07-31 MED ORDER — IBUPROFEN 600 MG PO TABS: 600.0000 mg | ORAL_TABLET | Freq: Four times a day (QID) | ORAL | 0 refills | Status: DC

## 2020-07-31 NOTE — Discharge Summary (Addendum)
OB Discharge Summary  Patient Name: Robin Lloyd DOB: Jun 27, 1990 MRN: [id number]  Date of admission: 07/30/2020 Intrauterine pregnancy: [redacted]w[redacted]d   Admitting diagnosis: Normal labor [O80, Z37.9] Secondary diagnosis:   Date of discharge: 07/31/2020    Discharge diagnosis: Term Pregnancy Delivered     Prenatal history: [id number]   EDC : 07/31/2020, by Ultrasound  Prenatal care at [facility]  Primary provider : [facility] Prenatal course complicated by Anemia of pregnancy  Prenatal Labs: ABO, Rh: --/--/B POS (02/26 0100) /  Antibody: NEG (02/26 0100) Rubella: Immune (08/31 0000)  / RPR: NON REACTIVE (02/26 0113)  HBsAg: Negative (08/31 0000)  HIV: Non-reactive (12/08 0000)  GBS: Positive/-- (02/04 0000)                                    Hospital course:  Onset of Labor With Vaginal Delivery      30 y.o. yo [id number] at [redacted]w[redacted]d was admitted in Active Labor on 07/30/2020. Patient had an uncomplicated labor course as follows:  Membrane Rupture Time/Date: 6:16 AM ,07/30/2020   Delivery Method:Vaginal, Spontaneous  Episiotomy: None  Lacerations:  2nd degree;Perineal  Patient had a complicated postpartum course with a PPH of .  She is ambulating, tolerating a regular diet, passing flatus, and urinating well. Patient is discharged home in stable condition on 07/31/20.  Newborn Data: Birth date:07/30/2020  Birth time:8:52 AM  Gender:Female  Living status:Living  Apgars:2 ,8  Weight:3770 g  Delivering PROVIDER: Rhea Pink B                                                            Complications: PPH total QBL  Newborn Data: Live born female  Birth Weight: 8 lb 5 oz (3770 g) APGAR: 2, 8  Newborn Delivery   Birth date/time: 07/30/2020 08:52:00 Delivery type: Vaginal, Spontaneous      Baby Feeding: Bottle Disposition:home with mother  Post partum procedures:TXA, cytotec and methergine x 24 hours  Labs: Lab Results  Component Value Date   WBC 10.8 (H) 07/31/2020   HGB  9.6 (L) 07/31/2020   HCT 31.9 (L) 07/31/2020   MCV 80.6 07/31/2020   PLT 193 07/31/2020   CMP Latest Ref Rng & Units 05/08/2018  Glucose 70 - 99 mg/dL 93  BUN 6 - 20 mg/dL 9  Creatinine 2.50 - 5.39 mg/dL 7.67  Sodium 341 - 937 mmol/L 138  Potassium 3.5 - 5.1 mmol/L 3.9  Chloride 98 - 111 mmol/L 109  CO2 22 - 32 mmol/L 21(L)  Calcium 8.9 - 10.3 mg/dL 8.1(L)  Total Protein 6.5 - 8.1 g/dL 7.7  Total Bilirubin 0.3 - 1.2 mg/dL 0.5  Alkaline Phos 38 - 126 U/L 72  AST 15 - 41 U/L 24  ALT 0 - 44 U/L 16    Physical Exam @ time of discharge:  Vitals:   07/30/20 1615 07/30/20 2015 07/31/20 0015 07/31/20 0506  BP: 117/83 123/81 126/88 117/84  Pulse: 75 72 67 70  Resp: 18 18 18 18   Temp: 99.6 F (37.6 C) 98.4 F (36.9 C) 98.1 F (36.7 C) 97.7 F (36.5 C)  TempSrc: Oral Oral Oral Oral  SpO2: 99% 100% 100% 99%  Weight:  Height:       general: alert, cooperative and no distress lochia: appropriate uterine fundus: firm perineum: 2 degree laceration, approximated incision: N/A extremities: DVT Evaluation: No evidence of DVT seen on physical exam. Negative Homan's sign.  Discharge instructions:  "Baby and Me Booklet"  Hx of PP Eclampsia with last baby. Preeclampsia s/s discussed and reviewed with Annica. Pt verbalized to call with an S/S HX of Anemia of pregnancy, PPH blood loss. Asymptomatic. Pt to continue iron sulfate.  Discharge Medications:  Allergies as of 07/31/2020   No Known Allergies     Medication List    TAKE these medications   acetaminophen 500 MG tablet Commonly known as: TYLENOL Take 2 tablets (1,000 mg total) by mouth once for 1 dose.   ibuprofen 600 MG tablet Commonly known as: ADVIL Take 1 tablet (600 mg total) by mouth every 6 (six) hours.   prenatal multivitamin Tabs tablet Take 1 tablet by mouth daily at 12 noon.     Iron sulfate 325 mg q day  Diet: routine diet Activity: Advance as tolerated. Pelvic rest x 6 weeks.  Follow up:4  weeks  Signed: [name] MSN, CNM 07/31/2020, 10:55 AM

## 2020-07-31 NOTE — Anesthesia Postprocedure Evaluation (Signed)
Anesthesia Post Note  Patient: Adiana Smelcer  Procedure(s) Performed: AN AD HOC LABOR EPIDURAL     Patient location during evaluation: Mother Baby Anesthesia Type: Epidural Level of consciousness: awake and alert Pain management: pain level controlled Vital Signs Assessment: post-procedure vital signs reviewed and stable Respiratory status: spontaneous breathing, nonlabored ventilation and respiratory function stable Cardiovascular status: stable Postop Assessment: no headache, no backache and epidural receding Anesthetic complications: no   No complications documented.  Last Vitals:  Vitals:   07/31/20 0015 07/31/20 0506  BP: 126/88 117/84  Pulse: 67 70  Resp: 18 18  Temp: 36.7 C 36.5 C  SpO2: 100% 99%    Last Pain:  Vitals:   07/31/20 0750  TempSrc:   PainSc: 5    Pain Goal: Patients Stated Pain Goal: 2 (07/30/20 1809)                 Rica Records

## 2020-07-31 NOTE — Progress Notes (Signed)
Patient requested that I call provider to see if she could get stronger pain medication Patient currently has ibuprofen and tylenol ordered. Patient did not want to take the tylenol and states that the ibuprofen is not working.Rhea Pink states that she would like the patient to take the tylenol and ibuprofen together.No new orders were given at this time

## 2020-08-02 LAB — SURGICAL PATHOLOGY

## 2020-10-11 ENCOUNTER — Other Ambulatory Visit: Payer: Self-pay

## 2020-10-11 ENCOUNTER — Encounter (HOSPITAL_COMMUNITY): Payer: Self-pay | Admitting: Emergency Medicine

## 2020-10-11 ENCOUNTER — Emergency Department (HOSPITAL_COMMUNITY)
Admission: EM | Admit: 2020-10-11 | Discharge: 2020-10-11 | Disposition: A | Discharge status: Home / Self Care | Payer: Medicaid Other | Attending: Emergency Medicine | Admitting: Emergency Medicine

## 2020-10-11 DIAGNOSIS — I10 Essential (primary) hypertension: Secondary | ICD-10-CM | POA: Diagnosis not present

## 2020-10-11 DIAGNOSIS — R519 Headache, unspecified: Secondary | ICD-10-CM | POA: Diagnosis present

## 2020-10-11 LAB — CBC WITH DIFFERENTIAL/PLATELET
Abs Immature Granulocytes: 0.01 10*3/uL (ref 0.00–0.07)
Basophils Absolute: 0 10*3/uL (ref 0.0–0.1)
Basophils Relative: 1 %
Eosinophils Absolute: 0 10*3/uL (ref 0.0–0.5)
Eosinophils Relative: 1 %
HCT: 37.2 % (ref 36.0–46.0)
Hemoglobin: 11.4 g/dL — ABNORMAL LOW (ref 12.0–15.0)
Immature Granulocytes: 0 %
Lymphocytes Relative: 19 %
Lymphs Abs: 1.1 10*3/uL (ref 0.7–4.0)
MCH: 24.4 pg — ABNORMAL LOW (ref 26.0–34.0)
MCHC: 30.6 g/dL (ref 30.0–36.0)
MCV: 79.7 fL — ABNORMAL LOW (ref 80.0–100.0)
Monocytes Absolute: 0.3 10*3/uL (ref 0.1–1.0)
Monocytes Relative: 6 %
Neutro Abs: 4.2 10*3/uL (ref 1.7–7.7)
Neutrophils Relative %: 73 %
Platelets: 271 10*3/uL (ref 150–400)
RBC: 4.67 MIL/uL (ref 3.87–5.11)
RDW: 20.1 % — ABNORMAL HIGH (ref 11.5–15.5)
WBC: 5.7 10*3/uL (ref 4.0–10.5)
nRBC: 0 % (ref 0.0–0.2)

## 2020-10-11 LAB — COMPREHENSIVE METABOLIC PANEL
ALT: 9 U/L (ref 0–44)
AST: 23 U/L (ref 15–41)
Albumin: 4.2 g/dL (ref 3.5–5.0)
Alkaline Phosphatase: 61 U/L (ref 38–126)
Anion gap: 6 (ref 5–15)
BUN: 6 mg/dL (ref 6–20)
CO2: 26 mmol/L (ref 22–32)
Calcium: 9.2 mg/dL (ref 8.9–10.3)
Chloride: 104 mmol/L (ref 98–111)
Creatinine, Ser: 0.59 mg/dL (ref 0.44–1.00)
GFR, Estimated: >60 mL/min (ref >60)
Glucose, Bld: 92 mg/dL (ref 70–99)
Potassium: 3.5 mmol/L (ref 3.5–5.1)
Sodium: 136 mmol/L (ref 135–145)
Total Bilirubin: 0.5 mg/dL (ref 0.3–1.2)
Total Protein: 8.5 g/dL — ABNORMAL HIGH (ref 6.5–8.1)

## 2020-10-11 MED ORDER — KETOROLAC TROMETHAMINE 30 MG/ML IJ SOLN
30.0000 mg | Freq: Once | INTRAMUSCULAR | Status: AC
  Administered 2020-10-11: 30 mg via INTRAVENOUS
  Filled 2020-10-11: qty 1

## 2020-10-11 MED ORDER — LISINOPRIL 10 MG PO TABS: 10.0000 mg | ORAL_TABLET | Freq: Every day | ORAL | 1 refills | Status: DC

## 2020-10-11 MED ORDER — DIPHENHYDRAMINE HCL 50 MG/ML IJ SOLN
25.0000 mg | Freq: Once | INTRAMUSCULAR | Status: AC
  Administered 2020-10-11: 25 mg via INTRAVENOUS
  Filled 2020-10-11: qty 1

## 2020-10-11 MED ORDER — METOCLOPRAMIDE HCL 5 MG/ML IJ SOLN
10.0000 mg | Freq: Once | INTRAMUSCULAR | Status: AC
  Administered 2020-10-11: 10 mg via INTRAVENOUS
  Filled 2020-10-11: qty 2

## 2020-10-11 MED ORDER — NAPROXEN 500 MG PO TABS: ORAL_TABLET | ORAL | 0 refills | Status: DC

## 2020-10-11 NOTE — Discharge Instructions (Addendum)
Follow-up with a family doctor in the next couple weeks for your blood pressure and headaches

## 2020-10-11 NOTE — ED Notes (Signed)
Pt b/p is high provider is aware

## 2020-10-11 NOTE — ED Triage Notes (Signed)
Pt reports that she had same headache for 2 weeks that is not relieved with OTC medications.

## 2020-10-11 NOTE — ED Provider Notes (Signed)
Robin Lloyd Provider Note   CSN: [id number] Arrival date & time: 10/11/20  1023     History Chief Complaint  Patient presents with  . Migraine    Robin Lloyd is a 30 y.o. female.  Patient complains of a headache and she states she has had blood pressure problems before but has not taken her medicine in a while  The history is provided by the patient and medical records. No language interpreter was used.  Migraine This is a new problem. The current episode started 6 to 12 hours ago. The problem occurs constantly. The problem has not changed since onset.Associated symptoms include headaches. Pertinent negatives include no chest pain and no abdominal pain. Nothing aggravates the symptoms. Nothing relieves the symptoms. She has tried nothing for the symptoms.       Past Medical History:  Diagnosis Date  . Anxiety   . Chlamydia   . GERD (gastroesophageal reflux disease)   . Ovarian cyst   . Vaginal Pap smear, abnormal     Patient Active Problem List   Diagnosis Date Noted  . Normal labor 07/30/2020  . SVD (2/26) 07/30/2020  . PPH (postpartum hemorrhage) 07/30/2020  . History of anemia 06/05/2014  . Anxiety state 06/05/2014    Past Surgical History:  Procedure Laterality Date  . DILATION AND CURETTAGE OF UTERUS  2015   Rupture of membranes     OB History    Gravida  3   Para  2   Term  2   Preterm  0   AB  1   Living  2     SAB      IAB      Ectopic      Multiple  0   Live Births  2        Obstetric Comments  Premature rupture at 5 months.        No family history on file.  Social History   Tobacco Use  . Smoking status: Never Smoker  . Smokeless tobacco: Never Used  Vaping Use  . Vaping Use: Never used  Substance Use Topics  . Alcohol use: No    Comment: occas.  . Drug use: No    Home Medications Prior to Admission medications   Medication Sig Start Date End Date Taking? Authorizing  Provider  lisinopril (ZESTRIL) 10 MG tablet Take 1 tablet (10 mg total) by mouth daily. 10/11/20  Yes Bethann Berkshire, MD  naproxen (NAPROSYN) 500 MG tablet Take 1 every 12 hours for headache that is not improved with Tylenol alone 10/11/20  Yes Bethann Berkshire, MD  ibuprofen (ADVIL) 600 MG tablet Take 1 tablet (600 mg total) by mouth every 6 (six) hours. 07/31/20   Holshouser, Gerhard Munch, CNM  Prenatal Vit-Fe Fumarate-FA (PRENATAL MULTIVITAMIN) TABS tablet Take 1 tablet by mouth daily at 12 noon.    [provider]    Allergies    Patient has no known allergies.  Review of Systems   Review of Systems  Constitutional: Negative for appetite change and fatigue.  HENT: Negative for congestion, ear discharge and sinus pressure.   Eyes: Negative for discharge.  Respiratory: Negative for cough.   Cardiovascular: Negative for chest pain.  Gastrointestinal: Negative for abdominal pain and diarrhea.  Genitourinary: Negative for frequency and hematuria.  Musculoskeletal: Negative for back pain.  Skin: Negative for rash.  Neurological: Positive for headaches. Negative for seizures.  Psychiatric/Behavioral: Negative for hallucinations.    Physical Exam Updated  Vital Signs BP (!) 143/106   Pulse 71   Temp 98.7 F (37.1 C) (Oral)   Resp 16   Ht 5\' 2"  (1.575 m)   Wt 69.4 kg   SpO2 100%   BMI 27.98 kg/m   Physical Exam Vitals and nursing note reviewed.  Constitutional:      Appearance: She is well-developed.  HENT:     Head: Normocephalic.     Nose: Nose normal.  Eyes:     General: No scleral icterus.    Conjunctiva/sclera: Conjunctivae normal.  Neck:     Thyroid: No thyromegaly.  Cardiovascular:     Rate and Rhythm: Normal rate and regular rhythm.     Heart sounds: No murmur heard. No friction rub. No gallop.   Pulmonary:     Breath sounds: No stridor. No wheezing or rales.  Chest:     Chest wall: No tenderness.  Abdominal:     General: There is no distension.      Tenderness: There is no abdominal tenderness. There is no rebound.  Musculoskeletal:        General: Normal range of motion.     Cervical back: Neck supple.  Lymphadenopathy:     Cervical: No cervical adenopathy.  Skin:    Findings: No erythema or rash.  Neurological:     Mental Status: She is alert and oriented to person, place, and time.     Motor: No abnormal muscle tone.     Coordination: Coordination normal.  Psychiatric:        Behavior: Behavior normal.     ED Results / Procedures / Treatments   Labs (all labs ordered are listed, but only abnormal results are displayed) Labs Reviewed  CBC WITH DIFFERENTIAL/PLATELET - Abnormal; Notable for the following components:      Result Value   Hemoglobin 11.4 (*)    MCV 79.7 (*)    MCH 24.4 (*)    RDW 20.1 (*)    All other components within normal limits  COMPREHENSIVE METABOLIC PANEL - Abnormal; Notable for the following components:   Total Protein 8.5 (*)    All other components within normal limits    EKG None  Radiology No results found.  Procedures Procedures   Medications Ordered in ED Medications  ketorolac (TORADOL) 30 MG/ML injection 30 mg (30 mg Intravenous Given 10/11/20 1230)  metoCLOPramide (REGLAN) injection 10 mg (10 mg Intravenous Given 10/11/20 1230)  diphenhydrAMINE (BENADRYL) injection 25 mg (25 mg Intravenous Given 10/11/20 1229)    ED Course  I have reviewed the triage vital signs and the nursing notes.  Pertinent labs & imaging results that were available during my care of the patient were reviewed by me and considered in my medical decision making (see chart for details).    MDM Rules/Calculators/A&P                         Patient with high blood pressure and headaches.  She is given Naprosyn and lisinopril and will follow up with PCP Final Clinical Impression(s) / ED Diagnoses Final diagnoses:  Primary hypertension  Bad headache    Rx / DC Orders ED Discharge Orders         Ordered     lisinopril (ZESTRIL) 10 MG tablet  Daily        10/11/20 1453    naproxen (NAPROSYN) 500 MG tablet        10/11/20 1453  Bethann Berkshire, MD 10/14/20 [phone]

## 2022-08-28 ENCOUNTER — Ambulatory Visit
Admission: EM | Admit: 2022-08-28 | Discharge: 2022-08-28 | Disposition: A | Discharge status: Home / Self Care | Payer: Medicaid Other | Attending: Internal Medicine | Admitting: Internal Medicine

## 2022-08-28 DIAGNOSIS — Z202 Contact with and (suspected) exposure to infections with a predominantly sexual mode of transmission: Secondary | ICD-10-CM | POA: Diagnosis not present

## 2022-08-28 DIAGNOSIS — Z113 Encounter for screening for infections with a predominantly sexual mode of transmission: Secondary | ICD-10-CM | POA: Diagnosis not present

## 2022-08-28 DIAGNOSIS — N898 Other specified noninflammatory disorders of vagina: Secondary | ICD-10-CM | POA: Diagnosis not present

## 2022-08-28 DIAGNOSIS — R030 Elevated blood-pressure reading, without diagnosis of hypertension: Secondary | ICD-10-CM | POA: Diagnosis present

## 2022-08-28 MED ORDER — DOXYCYCLINE HYCLATE 100 MG PO CAPS: 100.0000 mg | ORAL_CAPSULE | Freq: Two times a day (BID) | ORAL | 0 refills | Status: AC

## 2022-08-28 MED ORDER — METRONIDAZOLE 500 MG PO TABS: 500.0000 mg | ORAL_TABLET | Freq: Two times a day (BID) | ORAL | 0 refills | Status: DC

## 2022-08-28 NOTE — ED Triage Notes (Signed)
Pt presents for STD Testing after she states her partner was cheating.

## 2022-08-28 NOTE — Discharge Instructions (Signed)
I am treating you for chlamydia and trichomoniasis exposure with doxycycline and metronidazole antibiotic.  Take these with food and avoid alcohol.  Vaginal swab is pending.  Will call if anything is abnormal.  Please refrain from sexual activity until test results and treatment are complete.  Recommend that you monitor your blood pressure at home with home blood pressure cuff as well and follow-up with family doctor if it remains elevated.

## 2022-08-28 NOTE — ED Provider Notes (Signed)
[facility]    CSN: [id number] Arrival date & time: 08/28/22  1625      History   Chief Complaint Chief Complaint  Patient presents with  . STD Testing    HPI Robin Lloyd is a 32 y.o. female.   Patient presents today for STD testing.  Patient reports that her partner was recently unfaithful and he tested positive for trichomonas and chlamydia today.  He is actively being treated for the infection.  Patient reports that she has some vaginal discharge that started about a week ago.  Patient denies dysuria, urinary frequency, pelvic pain, abdominal pain, back pain, fever.  She is currently on her menstrual cycle.  Blood pressure is mildly elevated.  Patient not reporting chest pain, shortness of breath, nausea, vomiting, dizziness, blurred vision.  Patient reports history of preeclampsia in pregnancy but has not had to take medications for it in the past.     Past Medical History:  Diagnosis Date  . Anxiety   . Chlamydia   . GERD (gastroesophageal reflux disease)   . Ovarian cyst   . Vaginal Pap smear, abnormal     Patient Active Problem List   Diagnosis Date Noted  . Normal labor 07/30/2020  . SVD (2/26) 07/30/2020  . PPH (postpartum hemorrhage) 07/30/2020  . History of anemia 06/05/2014  . Anxiety state 06/05/2014    Past Surgical History:  Procedure Laterality Date  . DILATION AND CURETTAGE OF UTERUS  2015   Rupture of membranes    OB History     Gravida  3   Para  2   Term  2   Preterm  0   AB  1   Living  2      SAB      IAB      Ectopic      Multiple  0   Live Births  2        Obstetric Comments  Premature rupture at 5 months.          Home Medications    Prior to Admission medications   Medication Sig Start Date End Date Taking? Authorizing Provider  doxycycline (VIBRAMYCIN) 100 MG capsule Take 1 capsule (100 mg total) by mouth 2 (two) times daily for 7 days. 08/28/22 09/04/22 Yes Joah Patlan, Michele Rockers, FNP   metroNIDAZOLE (FLAGYL) 500 MG tablet Take 1 tablet (500 mg total) by mouth 2 (two) times daily. 08/28/22  Yes Bonni Neuser, Hildred Alamin E, FNP  ibuprofen (ADVIL) 600 MG tablet Take 1 tablet (600 mg total) by mouth every 6 (six) hours. 07/31/20   Holshouser, Theone Murdoch, CNM  lisinopril (ZESTRIL) 10 MG tablet Take 1 tablet (10 mg total) by mouth daily. 10/11/20   Milton Ferguson, MD  naproxen (NAPROSYN) 500 MG tablet Take 1 every 12 hours for headache that is not improved with Tylenol alone 10/11/20   Milton Ferguson, MD  Prenatal Vit-Fe Fumarate-FA (PRENATAL MULTIVITAMIN) TABS tablet Take 1 tablet by mouth daily at 12 noon.    [provider]    Family History History reviewed. No pertinent family history.  Social History Social History   Tobacco Use  . Smoking status: Never  . Smokeless tobacco: Never  Vaping Use  . Vaping Use: Never used  Substance Use Topics  . Alcohol use: No    Comment: occas.  . Drug use: No     Allergies   Patient has no known allergies.   Review of Systems Review of Systems Per HPI  Physical Exam Triage Vital Signs ED Triage Vitals  Enc Vitals Group     BP 08/28/22 1638 (!) 165/109     Pulse Rate 08/28/22 1638 87     Resp 08/28/22 1638 18     Temp 08/28/22 1638 98.1 F (36.7 C)     Temp Source 08/28/22 1638 Oral     SpO2 08/28/22 1638 98 %     Weight --      Height --      Head Circumference --      Peak Flow --      Pain Score 08/28/22 1637 0     Pain Loc --      Pain Edu? --      Excl. in [place]? --    No data found.  Updated Vital Signs BP (!) 165/109 (BP Location: Left Arm)   Pulse 87   Temp 98.1 F (36.7 C) (Oral)   Resp 18   LMP 08/27/2022   SpO2 98%   Visual Acuity Right Eye Distance:   Left Eye Distance:   Bilateral Distance:    Right Eye Near:   Left Eye Near:    Bilateral Near:     Physical Exam Constitutional:      General: She is not in acute distress.    Appearance: Normal appearance. She is not toxic-appearing or  diaphoretic.  HENT:     Head: Normocephalic and atraumatic.  Eyes:     Extraocular Movements: Extraocular movements intact.     Conjunctiva/sclera: Conjunctivae normal.  Pulmonary:     Effort: Pulmonary effort is normal.  Genitourinary:    Comments: Deferred with shared decision making.  Self swab performed. Neurological:     General: No focal deficit present.     Mental Status: She is alert and oriented to person, place, and time. Mental status is at baseline.  Psychiatric:        Mood and Affect: Mood normal.        Behavior: Behavior normal.        Thought Content: Thought content normal.        Judgment: Judgment normal.      UC Treatments / Results  Labs (all labs ordered are listed, but only abnormal results are displayed) Labs Reviewed  CERVICOVAGINAL ANCILLARY ONLY    EKG   Radiology No results found.  Procedures Procedures (including critical care time)  Medications Ordered in UC Medications - No data to display  Initial Impression / Assessment and Plan / UC Course  I have reviewed the triage vital signs and the nursing notes.  Pertinent labs & imaging results that were available during my care of the patient were reviewed by me and considered in my medical decision making (see chart for details).  Clinical Course as of 08/28/22 1701  Tue Aug 28, 2022  1701 Cervicovaginal ancillary only [HM]    Clinical Course User Index [HM] Teodora Medici, FNP    Patient here today for chlamydia and trichomoniasis exposure.  Will treat with metronidazole and doxycycline due to this.  Advised patient to take this medication with food and to avoid alcohol.  Cervicovaginal swab pending.  Awaiting result for any further treatment.  Patient advised to refrain from sexual activity until test results and treatment are complete.  Patient declined HIV and syphilis testing.  Patient's blood pressure is elevated with recheck being similar.  She reports that she has a history of  preeclampsia in pregnancy but has never had to take  blood pressure medication.  States that she has been arguing with someone today and has anxiety so is attributing symptoms to this.  Advised patient to monitor blood pressure very closely with home blood pressure cuff and follow-up with urgent care or family medicine if it remains elevated.  Patient is symptomatic regarding blood pressure so do not think that emergent evaluation is necessary.  Patient verbalized understanding and was agreeable with plan. Final Clinical Impressions(s) / UC Diagnoses   Final diagnoses:  STD exposure  Vaginal discharge  Screening examination for venereal disease  Elevated blood pressure reading     Discharge Instructions      I am treating you for chlamydia and trichomoniasis exposure with doxycycline and metronidazole antibiotic.  Take these with food and avoid alcohol.  Vaginal swab is pending.  Will call if anything is abnormal.  Please refrain from sexual activity until test results and treatment are complete.  Recommend that you monitor your blood pressure at home with home blood pressure cuff as well and follow-up with family doctor if it remains elevated.    ED Prescriptions     Medication Sig Dispense Auth. Provider   doxycycline (VIBRAMYCIN) 100 MG capsule Take 1 capsule (100 mg total) by mouth 2 (two) times daily for 7 days. 14 capsule [place], [place] E, [place]   metroNIDAZOLE (FLAGYL) 500 MG tablet Take 1 tablet (500 mg total) by mouth 2 (two) times daily. 14 tablet [place], Michele Rockers, [place]      PDMP not reviewed this encounter.   Teodora Medici,  08/28/22 1700

## 2022-08-29 LAB — CERVICOVAGINAL ANCILLARY ONLY
Bacterial Vaginitis (gardnerella): NEGATIVE
Candida Glabrata: NEGATIVE
Candida Vaginitis: POSITIVE — AB
Chlamydia: NEGATIVE
Comment: NEGATIVE
Comment: NEGATIVE
Comment: NEGATIVE
Comment: NEGATIVE
Comment: NEGATIVE
Comment: NORMAL
Neisseria Gonorrhea: NEGATIVE
Trichomonas: NEGATIVE

## 2022-08-30 ENCOUNTER — Telehealth (HOSPITAL_COMMUNITY): Payer: Self-pay | Admitting: Emergency Medicine

## 2022-08-30 MED ORDER — FLUCONAZOLE 150 MG PO TABS: 150.0000 mg | ORAL_TABLET | Freq: Once | ORAL | 0 refills | Status: AC

## 2023-03-02 ENCOUNTER — Other Ambulatory Visit: Payer: Self-pay

## 2023-03-02 ENCOUNTER — Inpatient Hospital Stay (HOSPITAL_COMMUNITY)
Admission: AD | Admit: 2023-03-02 | Discharge: 2023-03-02 | Disposition: A | Discharge status: Home / Self Care | Payer: 59 | Attending: Obstetrics and Gynecology | Admitting: Obstetrics and Gynecology

## 2023-03-02 ENCOUNTER — Inpatient Hospital Stay (HOSPITAL_COMMUNITY): Payer: 59

## 2023-03-02 ENCOUNTER — Encounter (HOSPITAL_COMMUNITY): Payer: Self-pay | Admitting: Obstetrics and Gynecology

## 2023-03-02 ENCOUNTER — Ambulatory Visit
Admission: EM | Admit: 2023-03-02 | Discharge: 2023-03-02 | Disposition: A | Discharge status: Other Acute Inpatient Hospital | Payer: 59

## 2023-03-02 DIAGNOSIS — O3680X0 Pregnancy with inconclusive fetal viability, not applicable or unspecified: Secondary | ICD-10-CM | POA: Diagnosis not present

## 2023-03-02 DIAGNOSIS — Z3A01 Less than 8 weeks gestation of pregnancy: Secondary | ICD-10-CM

## 2023-03-02 DIAGNOSIS — N760 Acute vaginitis: Secondary | ICD-10-CM | POA: Insufficient documentation

## 2023-03-02 DIAGNOSIS — B9689 Other specified bacterial agents as the cause of diseases classified elsewhere: Secondary | ICD-10-CM | POA: Diagnosis not present

## 2023-03-02 DIAGNOSIS — R109 Unspecified abdominal pain: Secondary | ICD-10-CM | POA: Diagnosis present

## 2023-03-02 DIAGNOSIS — O2391 Unspecified genitourinary tract infection in pregnancy, first trimester: Secondary | ICD-10-CM | POA: Diagnosis not present

## 2023-03-02 DIAGNOSIS — O26899 Other specified pregnancy related conditions, unspecified trimester: Secondary | ICD-10-CM

## 2023-03-02 LAB — WET PREP, GENITAL
Sperm: NONE SEEN
Trich, Wet Prep: NONE SEEN
WBC, Wet Prep HPF POC: >=10 — AB (ref <10)
Yeast Wet Prep HPF POC: NONE SEEN

## 2023-03-02 LAB — POCT PREGNANCY, URINE: Preg Test, Ur: POSITIVE — AB

## 2023-03-02 LAB — POCT URINALYSIS DIP (MANUAL ENTRY)
Bilirubin, UA: NEGATIVE
Blood, UA: NEGATIVE
Glucose, UA: NEGATIVE mg/dL
Ketones, POC UA: NEGATIVE mg/dL
Nitrite, UA: NEGATIVE
Protein Ur, POC: NEGATIVE mg/dL
Spec Grav, UA: 1.025 (ref 1.010–1.025)
Urobilinogen, UA: 1 U/dL
pH, UA: 6 (ref 5.0–8.0)

## 2023-03-02 LAB — CBC
HCT: 36.4 % (ref 36.0–46.0)
Hemoglobin: 12.1 g/dL (ref 12.0–15.0)
MCH: 29.4 pg (ref 26.0–34.0)
MCHC: 33.2 g/dL (ref 30.0–36.0)
MCV: 88.6 fL (ref 80.0–100.0)
Platelets: 262 10*3/uL (ref 150–400)
RBC: 4.11 MIL/uL (ref 3.87–5.11)
RDW: 13.3 % (ref 11.5–15.5)
WBC: 3.9 10*3/uL — ABNORMAL LOW (ref 4.0–10.5)
nRBC: 0 % (ref 0.0–0.2)

## 2023-03-02 LAB — URINALYSIS, ROUTINE W REFLEX MICROSCOPIC
Bilirubin Urine: NEGATIVE
Glucose, UA: NEGATIVE mg/dL
Hgb urine dipstick: NEGATIVE
Ketones, ur: NEGATIVE mg/dL
Leukocytes,Ua: NEGATIVE
Nitrite: NEGATIVE
Protein, ur: NEGATIVE mg/dL
Specific Gravity, Urine: 1.005 (ref 1.005–1.030)
pH: 6 (ref 5.0–8.0)

## 2023-03-02 LAB — ABO/RH: ABO/RH(D): B POS

## 2023-03-02 LAB — HCG, QUANTITATIVE, PREGNANCY: hCG, Beta Chain, Quant, S: 1192 m[IU]/mL — ABNORMAL HIGH (ref <5)

## 2023-03-02 MED ORDER — METRONIDAZOLE 500 MG PO TABS: 500.0000 mg | ORAL_TABLET | Freq: Two times a day (BID) | ORAL | 0 refills | Status: DC

## 2023-03-02 NOTE — ED Triage Notes (Addendum)
Headache; Nausea; Abd Pain; Hand Numbness Entered by Patient.  Started with Abd pain & Back Pain for about 3 days. Now today having a headache "with my fingers going numb". No cold symptoms. No fever. Some "nausea" at times.   "I am also worried about my constipation, last BM beginning of last week". Voids "normal".

## 2023-03-02 NOTE — Discharge Instructions (Signed)
You have constipation which is hard stools that are difficult to pass. It is important to have regular bowel movements every 1-3 days that are soft and easy to pass. Hard stools increase your risk of hemorrhoids and are very uncomfortable.   To prevent constipation you can increase the amount of fiber in your diet. Examples of foods with fiber are leafy greens, whole grain breads, oatmeal and other grains.  It is also important to drink at least eight 8oz glass of water everyday.   If you have not has a bowel movement in 4-5 days you made need to clean out your bowel.  This will have establish normal movement through your bowel.    Miralax Clean out Take 8 capfuls of miralax in 64 oz of gatorade. You can use any fluid that appeals to you (gatorade, water, juice) Continue to drink at least eight 8 oz glasses of water throughout the day You can repeat with another 8 capfuls of miralax in 64 oz of gatorade if you are not having a large amount of stools You will need to be at home and close to a bathroom for about 8 hours when you do the above as you may need to go to the bathroom frequently.   After you are cleaned out: - Start Colace'100mg'$  twice daily - Start Miralax once daily - Start a daily fiber supplement like metamucil or citrucel - You can safely use enemas in pregnancy  - if you are having diarrhea you can reduce to Colace once a day or miralax every other day or a 1/2 capful daily.                    Safe Medications in Pregnancy    Acne: Benzoyl Peroxide Salicylic Acid  Backache/Headache: Tylenol: 2 regular strength every 4 hours OR              2 Extra strength every 6 hours  Colds/Coughs/Allergies: Benadryl (alcohol free) 25 mg every 6 hours as needed Breath right strips Claritin Cepacol throat lozenges Chloraseptic throat spray Cold-Eeze- up to three times per day Cough drops, alcohol free Flonase (by prescription only) Guaifenesin Mucinex Robitussin DM (plain only,  alcohol free) Saline nasal spray/drops Sudafed (pseudoephedrine) & Actifed ** use only after [redacted] weeks gestation and if you do not have high blood pressure Tylenol Vicks Vaporub Zinc lozenges Zyrtec   Constipation: Colace Ducolax suppositories Fleet enema Glycerin suppositories Metamucil Milk of magnesia Miralax Senokot Smooth move tea  Diarrhea: Kaopectate Imodium A-D  *NO pepto Bismol  Hemorrhoids: Anusol Anusol HC Preparation H Tucks  Indigestion: Tums Maalox Mylanta Zantac  Pepcid  Insomnia: Benadryl (alcohol free) '25mg'$  every 6 hours as needed Tylenol PM Unisom, no Gelcaps  Leg Cramps: Tums MagGel  Nausea/Vomiting:  Bonine Dramamine Emetrol Ginger extract Sea bands Meclizine  Nausea medication to take during pregnancy:  Unisom (doxylamine succinate 25 mg tablets) Take one tablet daily at bedtime. If symptoms are not adequately controlled, the dose can be increased to a maximum recommended dose of two tablets daily (1/2 tablet in the morning, 1/2 tablet mid-afternoon and one at bedtime). Vitamin B6 '100mg'$  tablets. Take one tablet twice a day (up to 200 mg per day).  Skin Rashes: Aveeno products Benadryl cream or '25mg'$  every 6 hours as needed Calamine Lotion 1% cortisone cream  Yeast infection: Gyne-lotrimin 7 Monistat 7   **If taking multiple medications, please check labels to avoid duplicating the same active ingredients **take medication as directed on  the label ** Do not exceed 4000 mg of tylenol in 24 hours **Do not take medications that contain aspirin or ibuprofen

## 2023-03-02 NOTE — ED Provider Notes (Signed)
Patient here today for evaluation of lower abdominal pain.  She reports that she is also had headache and numbness in her fingers at times.  Pregnancy test in office was positive.  Recommended further evaluation in MAU given abdominal pain and other concerning symptoms.  Patient is agreeable and will transport via POV.   Tomi Bamberger, PA-C 03/02/23 [phone]

## 2023-03-02 NOTE — ED Notes (Signed)
Patient is being discharged from the Urgent Care and sent to the Emergency Department via POV . Per RM, patient is in need of higher level of care due to positive pregnancy and abd pain. Patient is aware and verbalizes understanding of plan of care.  Vitals:   03/02/23 0932 03/02/23 0934  BP: 118/77   Pulse: (!) 104 100  Resp: 18   Temp: 99 F (37.2 C)   SpO2: 99%

## 2023-03-02 NOTE — MAU Note (Signed)
Robin Lloyd is a 32 y.o. at Unknown here in MAU reporting: pain lower mid abdominal and left upper abdominal pain that started 3 days ago. Denies taking any recent medications. Reports nausea started 2 weeks ago. Reports fingers tingling that has been ongoing the last month, states she had had this problem her whole life but it has been worse in the last few weeks. Reports constipation and has not BM in 12 days and is passing gas. Denies taking any stool softener or laxatives.   Denies any vaginal bleeding, itching, or odor. Reports milky white discharge that has no scent.   +UPT at urgent care today.  LMP: 01/24/23 Pain score: 6 abdomen  Vitals:   03/02/23 1035  BP: 125/77  Pulse: 84  Resp: 16  Temp: 98.1 F (36.7 C)  SpO2: 100%     FHT:n/a Lab orders placed from triage:  UA, Vaginal Swabs

## 2023-03-02 NOTE — MAU Provider Note (Signed)
Chief Complaint:  Abdominal Pain  HPI  Robin Lloyd is a 32 y.o. [id number] at [redacted]w[redacted]d who presents to maternity admissions reporting lower centralized abdominal pain that started 3 days ago, as well as upper left quadrant abdominal pain. Also has nausea for the past two weeks without vomiting, constipation x12 days but has not taken anything to resolve because she was not sure what to take. Has occasional finger tingling/numbness but this is unrelated to her pregnancy, has been going on for years. Denies vaginal discharge or bleeding, no other physical complaints.  Pregnancy Course: Has not established OB care yet, previous babies born with [facility].  Past Medical History:  Diagnosis Date   Anxiety    Chlamydia    GERD (gastroesophageal reflux disease)    Ovarian cyst    Vaginal Pap smear, abnormal    OB History  Gravida Para Term Preterm AB Living  4 2 2  0 1 2  SAB IAB Ectopic Multiple Live Births        0 2    # Outcome Date GA Lbr Len/2nd Weight Sex Type Anes PTL Lv  4 Current           3 Term 07/30/20 [redacted]w[redacted]d 10:35 / 00:47 8 lb 5 oz (3.77 kg) M Vag-Spont EPI  LIV  2 Term 04/24/18 [redacted]w[redacted]d 03:34 / 00:31 8 lb 0.6 oz (3.646 kg) F Vag-Spont EPI  LIV  1 AB 06/09/14 101w3d    SAB   FD     Complications: Preterm premature rupture of membranes (PPROM) with unknown onset of labor    Obstetric Comments  Premature rupture at 5 months.   Past Surgical History:  Procedure Laterality Date   DILATION AND CURETTAGE OF UTERUS  2015   Rupture of membranes   No family history on file. Social History   Tobacco Use   Smoking status: Never   Smokeless tobacco: Never  Vaping Use   Vaping status: Never Used  Substance Use Topics   Alcohol use: No    Comment: Occassionally.   Drug use: No   No Known Allergies Medications Prior to Admission  Medication Sig Dispense Refill Last Dose   acetaminophen (TYLENOL) 500 MG tablet Take 1,000 mg by mouth as directed.       acetaminophen-codeine (TYLENOL #3) 300-30 MG tablet Take 1 tablet by mouth every 6 (six) hours.      aspirin EC 81 MG tablet Take 81 mg by mouth daily.      ibuprofen (ADVIL) 600 MG tablet Take 1 tablet (600 mg total) by mouth every 6 (six) hours. 30 tablet 0    ibuprofen (ADVIL) 600 MG tablet Take 1 tablet by mouth every 6 (six) hours.      Iron, Ferrous Sulfate, 325 (65 Fe) MG TABS Take 1 tablet every day by oral route.      lisinopril (ZESTRIL) 10 MG tablet Take 1 tablet (10 mg total) by mouth daily. 30 tablet 1    metroNIDAZOLE (FLAGYL) 500 MG tablet Take 1 tablet (500 mg total) by mouth 2 (two) times daily. 14 tablet 0    naproxen (NAPROSYN) 500 MG tablet Take 1 every 12 hours for headache that is not improved with Tylenol alone 20 tablet 0    Prenatal MV & Min w/FA-DHA (PRENATAL GUMMIES PO) Take 1 tablet every day by oral route.      Prenatal Vit-Fe Fumarate-FA (PRENATAL MULTIVITAMIN) TABS tablet Take 1 tablet by mouth daily at 12 noon.  Prenatal Vit-Fe Fumarate-FA (PRENATAL VITAMIN PO) Take by mouth daily.      Prenatal Vit-Fe Phos-FA-Omega (VITAFOL GUMMIES) 3.33-0.333-34.8 MG CHEW       terconazole (TERAZOL 3) 0.8 % vaginal cream Place 1 applicator vaginally at bedtime.      I have reviewed patient's Past Medical Hx, Surgical Hx, Family Hx, Social Hx, medications and allergies.   ROS  Pertinent items noted in HPI and remainder of comprehensive ROS otherwise negative.   PHYSICAL EXAM  Patient Vitals for the past 24 hrs:  BP Temp Temp src Pulse Resp SpO2 Height Weight  03/02/23 1035 125/77 98.1 F (36.7 C) Oral 84 16 100 % 5\' 2"  (1.575 m) 138 lb 8 oz (62.8 kg)   Constitutional: Well-developed, well-nourished female in no acute distress.  Cardiovascular: normal rate & rhythm, warm and well-perfused Respiratory: normal effort, no problems with respiration noted GI: Abd soft, non-tender, non-distended MS: Extremities nontender, no edema, normal ROM Neurologic: Alert and oriented  x 4.  GU: no CVA tenderness Pelvic: exam deferred, self-swabs collected and sent to U/S   Labs: Results for orders placed or performed during the hospital encounter of 03/02/23 (from the past 24 hour(s))  Urinalysis, Routine w reflex microscopic -Urine, Clean Catch     Status: Abnormal   Collection Time: 03/02/23 10:47 AM  Result Value Ref Range   Color, Urine STRAW (A) YELLOW   APPearance CLEAR CLEAR   Specific Gravity, Urine 1.005 1.005 - 1.030   pH 6.0 5.0 - 8.0   Glucose, UA NEGATIVE NEGATIVE mg/dL   Hgb urine dipstick NEGATIVE NEGATIVE   Bilirubin Urine NEGATIVE NEGATIVE   Ketones, ur NEGATIVE NEGATIVE mg/dL   Protein, ur NEGATIVE NEGATIVE mg/dL   Nitrite NEGATIVE NEGATIVE   Leukocytes,Ua NEGATIVE NEGATIVE  Wet prep, genital     Status: Abnormal   Collection Time: 03/02/23 10:47 AM   Specimen: Urine, Clean Catch  Result Value Ref Range   Yeast Wet Prep HPF POC NONE SEEN NONE SEEN   Trich, Wet Prep NONE SEEN NONE SEEN   Clue Cells Wet Prep HPF POC PRESENT (A) NONE SEEN   WBC, Wet Prep HPF POC >=10 (A) <10   Sperm NONE SEEN   Pregnancy, urine POC     Status: Abnormal   Collection Time: 03/02/23 10:49 AM  Result Value Ref Range   Preg Test, Ur POSITIVE (A) NEGATIVE  CBC     Status: Abnormal   Collection Time: 03/02/23 11:04 AM  Result Value Ref Range   WBC 3.9 (L) 4.0 - 10.5 K/uL   RBC 4.11 3.87 - 5.11 MIL/uL   Hemoglobin 12.1 12.0 - 15.0 g/dL   HCT 10.2 72.5 - 36.6 %   MCV 88.6 80.0 - 100.0 fL   MCH 29.4 26.0 - 34.0 pg   MCHC 33.2 30.0 - 36.0 g/dL   RDW 44.0 34.7 - 42.5 %   Platelets 262 150 - 400 K/uL   nRBC 0.0 0.0 - 0.2 %  ABO/Rh     Status: None   Collection Time: 03/02/23 11:04 AM  Result Value Ref Range   ABO/RH(D) B POS    No rh immune globuloin      NOT A RH IMMUNE GLOBULIN CANDIDATE, PT RH POSITIVE Performed at [facility], [address] [place], [place] [address]   hCG, quantitative, pregnancy     Status: Abnormal   Collection Time:  03/02/23 11:04 AM  Result Value Ref Range   hCG, Beta Chain, Quant, S  1,192 (H) <5 mIU/mL   Imaging:  US OB LESS THAN 14 WEEKS WITH OB TRANSVAGINAL  Result Date: 03/02/2023 CLINICAL DATA:  Cramping with nausea. EXAM: OBSTETRIC <14 WK [place] AND TRANSVAGINAL OB US TECHNIQUE: Both transabdominal and transvaginal ultrasound examinations were performed for complete evaluation of the gestation as well as the maternal uterus, adnexal regions, and pelvic cul-de-sac. Transvaginal technique was performed to assess early pregnancy. COMPARISON:  None Available. FINDINGS: Intrauterine gestational sac: None Yolk sac:  Not Visualized. Embryo:  Not Visualized. Cardiac Activity: Not Visualized. Heart Rate: N/A  bpm Maternal uterus/adnexae: Right ovary: Normal Left ovary: Normal Other :Signs of decidual reaction with thickening of the endometrium measuring up to 1.8 cm. Within the distal canal there is a focal, irregular fluid collection which measures 0.6 by 1.5 x 0.7 cm (volume = 0.3 cm^3). This may represent a small subchorionic hematoma. Free fluid:  No free fluid identified. IMPRESSION: 1. No intrauterine gestational sac, yolk sac, or fetal pole identified. Differential considerations include intrauterine pregnancy too early to be sonographically visualized, missed abortion, or ectopic pregnancy. Followup ultrasound is recommended in 10-14 days for further evaluation. 2. Signs of decidual reaction with diffuse thickening of the endometrium. 3. Irregular fluid collection within the distal canal measuring 0.3 cc may represent a small subchorionic hematoma. Attention on follow-up imaging advised. Electronically Signed   By: Signa Kell M.D.   On: 03/02/2023 12:22    MDM & MAU COURSE  MDM: Moderate  MAU Course: Orders Placed This Encounter  Procedures   Wet prep, genital   US OB LESS THAN 14 WEEKS WITH OB TRANSVAGINAL   Urinalysis, Routine w reflex microscopic -Urine, Clean Catch   CBC   hCG, quantitative,  pregnancy   Pregnancy, urine POC   ABO/Rh   Discharge patient   Meds ordered this encounter  Medications   metroNIDAZOLE (FLAGYL) 500 MG tablet    Sig: Take 1 tablet (500 mg total) by mouth 2 (two) times daily.    Dispense:  14 tablet    Refill:  0   Pt clinically stable, medically screened and sent for labs/ultrasound.   Shared results with patient - BV and pregnancy of unknown location, gave bleeding precautions and advised of necessary follow up. Has lab appointment for Monday at [facility].   ASSESSMENT   1. Bacterial vaginosis   2. Pregnancy of unknown anatomic location    PLAN  Discharge home in stable condition with ectopic precautions.     Follow-up Information     [facility] Follow up in 2 day(s).   Specialty: Obstetrics and Gynecology Why: Repeat HCG at 11am on Monday Contact information: [address] [place] [address] [phone]                Future Appointments  Date Time Provider Department Center  03/04/2023 11:00 AM [facility]    Allergies as of 03/02/2023   No Known Allergies      Medication List     STOP taking these medications    acetaminophen-codeine 300-30 MG tablet Commonly known as: TYLENOL #3   aspirin EC 81 MG tablet   ibuprofen 600 MG tablet Commonly known as: ADVIL   lisinopril 10 MG tablet Commonly known as: ZESTRIL   naproxen 500 MG tablet Commonly known as: NAPROSYN   PRENATAL GUMMIES PO   prenatal multivitamin Tabs tablet   PRENATAL VITAMIN PO   terconazole 0.8 % vaginal cream Commonly  known as: TERAZOL 3       TAKE these medications    acetaminophen 500 MG tablet Commonly known as: TYLENOL Take 1,000 mg by mouth as directed.   Iron (Ferrous Sulfate) 325 (65 Fe) MG Tabs Take 1 tablet every day by oral route.   metroNIDAZOLE 500 MG tablet Commonly known as: FLAGYL Take 1 tablet (500 mg total) by mouth 2 (two)  times daily.   Vitafol Gummies 3.33-0.333-34.8 MG Chew       Edd Arbour, CNM, MSN, [employer] Certified Nurse Midwife, [facility]

## 2023-03-04 ENCOUNTER — Other Ambulatory Visit: Payer: Self-pay

## 2023-03-04 ENCOUNTER — Ambulatory Visit (INDEPENDENT_AMBULATORY_CARE_PROVIDER_SITE_OTHER): Payer: 59

## 2023-03-04 VITALS — BP 126/80 | HR 89 | Ht 62.0 in | Wt 142.5 lb

## 2023-03-04 DIAGNOSIS — O3680X0 Pregnancy with inconclusive fetal viability, not applicable or unspecified: Secondary | ICD-10-CM

## 2023-03-04 DIAGNOSIS — Z3A01 Less than 8 weeks gestation of pregnancy: Secondary | ICD-10-CM

## 2023-03-04 LAB — BETA HCG QUANT (REF LAB): hCG Quant: 2185 m[IU]/mL

## 2023-03-04 LAB — GC/CHLAMYDIA PROBE AMP (~~LOC~~) NOT AT ARMC
Chlamydia: NEGATIVE
Comment: NEGATIVE
Comment: NORMAL
Neisseria Gonorrhea: NEGATIVE

## 2023-03-04 NOTE — Progress Notes (Signed)
Beta HCG Follow-up Visit  Early Ord presents to [facility] for follow-up beta HCG lab. She was seen in MAU for vaginal bleeding on 03/02/23. Patient reports pain today. Discussed with patient that we are following beta HCG levels today. Results will be back in approximately 2 hours. Valid contact number for patient confirmed. I will call the patient with results.   Beta HCG results: On 03/02/23-- 1,192 Today--2,185  Results and patient history reviewed with Scheryl Darter, MD, who states patient is to have US done in 7-10 days from MAU visit. Patient called and informed of plan for follow-up. Patient scheduled for [place] on 03/11/23 at 4:15PM.   Meryl Crutch 03/04/2023 1:27 PM

## 2023-03-11 ENCOUNTER — Other Ambulatory Visit: Payer: Self-pay

## 2023-03-11 ENCOUNTER — Ambulatory Visit (INDEPENDENT_AMBULATORY_CARE_PROVIDER_SITE_OTHER): Payer: 59

## 2023-03-11 DIAGNOSIS — Z3A01 Less than 8 weeks gestation of pregnancy: Secondary | ICD-10-CM | POA: Diagnosis not present

## 2023-03-11 DIAGNOSIS — O3680X0 Pregnancy with inconclusive fetal viability, not applicable or unspecified: Secondary | ICD-10-CM

## 2023-03-11 DIAGNOSIS — Z3481 Encounter for supervision of other normal pregnancy, first trimester: Secondary | ICD-10-CM

## 2023-03-11 NOTE — Progress Notes (Signed)
Patient here for [place] to determine viability and location of pregnancy. Pt told by [place] tech that she should stop by front desk to inquire about lab work. Front office asked RN to speak with patient. Per chart review, previous HCG levels as follows:  Beta HCG results: 03/02/23 1192  03/04/23 2185   Preliminary report of [place] today shows IUP measuring [redacted]w[redacted]d. Reviewed with patient that no labs are indicated today. Explained provider will review images and final report will be added to MyChart account including exact dating, confirmation of viability, and any instructions for follow up.  Marjo Bicker 03/11/2023 4:52 PM

## 2023-03-29 ENCOUNTER — Inpatient Hospital Stay (HOSPITAL_COMMUNITY): Payer: 59

## 2023-03-29 ENCOUNTER — Inpatient Hospital Stay (HOSPITAL_COMMUNITY)
Admission: AD | Admit: 2023-03-29 | Discharge: 2023-03-29 | Disposition: A | Discharge status: Home / Self Care | Payer: 59 | Attending: Obstetrics and Gynecology | Admitting: Obstetrics and Gynecology

## 2023-03-29 ENCOUNTER — Encounter (HOSPITAL_COMMUNITY): Payer: Self-pay | Admitting: Obstetrics and Gynecology

## 2023-03-29 DIAGNOSIS — R112 Nausea with vomiting, unspecified: Secondary | ICD-10-CM | POA: Diagnosis not present

## 2023-03-29 DIAGNOSIS — Z3A09 9 weeks gestation of pregnancy: Secondary | ICD-10-CM | POA: Diagnosis not present

## 2023-03-29 DIAGNOSIS — R1032 Left lower quadrant pain: Secondary | ICD-10-CM | POA: Diagnosis not present

## 2023-03-29 DIAGNOSIS — K5900 Constipation, unspecified: Secondary | ICD-10-CM | POA: Insufficient documentation

## 2023-03-29 DIAGNOSIS — O26891 Other specified pregnancy related conditions, first trimester: Secondary | ICD-10-CM | POA: Insufficient documentation

## 2023-03-29 DIAGNOSIS — O219 Vomiting of pregnancy, unspecified: Secondary | ICD-10-CM | POA: Insufficient documentation

## 2023-03-29 DIAGNOSIS — O99611 Diseases of the digestive system complicating pregnancy, first trimester: Secondary | ICD-10-CM | POA: Diagnosis not present

## 2023-03-29 LAB — CBC
HCT: 32.1 % — ABNORMAL LOW (ref 36.0–46.0)
Hemoglobin: 10.8 g/dL — ABNORMAL LOW (ref 12.0–15.0)
MCH: 29.5 pg (ref 26.0–34.0)
MCHC: 33.6 g/dL (ref 30.0–36.0)
MCV: 87.7 fL (ref 80.0–100.0)
Platelets: 225 10*3/uL (ref 150–400)
RBC: 3.66 MIL/uL — ABNORMAL LOW (ref 3.87–5.11)
RDW: 12.9 % (ref 11.5–15.5)
WBC: 5.1 10*3/uL (ref 4.0–10.5)
nRBC: 0 % (ref 0.0–0.2)

## 2023-03-29 LAB — BASIC METABOLIC PANEL
Anion gap: 8 (ref 5–15)
BUN: 10 mg/dL (ref 6–20)
CO2: 21 mmol/L — ABNORMAL LOW (ref 22–32)
Calcium: 9.5 mg/dL (ref 8.9–10.3)
Chloride: 103 mmol/L (ref 98–111)
Creatinine, Ser: 0.6 mg/dL (ref 0.44–1.00)
GFR, Estimated: >60 mL/min (ref >60)
Glucose, Bld: 96 mg/dL (ref 70–99)
Potassium: 3.9 mmol/L (ref 3.5–5.1)
Sodium: 132 mmol/L — ABNORMAL LOW (ref 135–145)

## 2023-03-29 LAB — URINALYSIS, ROUTINE W REFLEX MICROSCOPIC
Bilirubin Urine: NEGATIVE
Glucose, UA: NEGATIVE mg/dL
Hgb urine dipstick: NEGATIVE
Ketones, ur: NEGATIVE mg/dL
Leukocytes,Ua: NEGATIVE
Nitrite: NEGATIVE
Protein, ur: NEGATIVE mg/dL
Specific Gravity, Urine: 1.026 (ref 1.005–1.030)
pH: 5 (ref 5.0–8.0)

## 2023-03-29 MED ORDER — PROMETHAZINE HCL 25 MG PO TABS
25.0000 mg | ORAL_TABLET | Freq: Once | ORAL | Status: AC
  Administered 2023-03-29: 25 mg via ORAL
  Filled 2023-03-29: qty 1

## 2023-03-29 MED ORDER — TRANSDERM-SCOP 1 MG/3DAYS TD PT72: 1.0000 | MEDICATED_PATCH | TRANSDERMAL | 12 refills | Status: AC

## 2023-03-29 MED ORDER — ONDANSETRON 4 MG PO TBDP
4.0000 mg | ORAL_TABLET | Freq: Four times a day (QID) | ORAL | 0 refills | Status: AC | PRN
Start: 2023-03-29 — End: ?

## 2023-03-29 MED ORDER — SCOPOLAMINE 1 MG/3DAYS TD PT72
1.0000 | MEDICATED_PATCH | Freq: Once | TRANSDERMAL | Status: DC
  Administered 2023-03-29: 1.5 mg via TRANSDERMAL
  Filled 2023-03-29: qty 1

## 2023-03-29 MED ORDER — PROMETHAZINE HCL 25 MG PO TABS
25.0000 mg | ORAL_TABLET | Freq: Four times a day (QID) | ORAL | 2 refills | Status: AC | PRN
Start: 2023-03-29 — End: ?

## 2023-03-29 MED ORDER — POLYETHYLENE GLYCOL 3350 17 GM/SCOOP PO POWD
1.0000 | Freq: Once | ORAL | 0 refills | Status: AC
Start: 2023-03-29 — End: 2023-03-29

## 2023-03-29 NOTE — MAU Note (Signed)
Morrie Sheldon, Phlebotomist, busy with another pt. Will send pt to u/s before labs drawn.

## 2023-03-29 NOTE — MAU Note (Signed)
.  Robin Lloyd is a 32 y.o. at [redacted]w[redacted]d here in MAU reporting be awaken by a lot of LLQ pain about an hour ago.  Went to [facility] and had a BM - has been constipated. While on toilet got hot and cold and felt like she may pass out. Has been in pain LLQ all wk but worse this am. Pain got worse after BM and was a 10 but now a 7.  Denies VB. Has not felt good for 2 days Onset of complaint:  Pain score: 7 Vitals:   03/29/23 0335 03/29/23 0338  BP:  114/73  Pulse: 73   Resp: 18   Temp: 98 F (36.7 C)   SpO2: 100%      FHT:n/a Lab orders placed from triage:  u/a

## 2023-03-29 NOTE — MAU Provider Note (Signed)
Chief Complaint: Abdominal Pain and Shortness of Breath   Event Date/Time   First Provider Initiated Contact with Patient 03/29/23 0350        SUBJECTIVE HPI: Robin Lloyd is a 32 y.o. [id number] at [redacted]w[redacted]d by LMP who presents to maternity admissions reporting Left lower abdominal pain  Has had some constipation and felt somewhat better after bowel movement.  But pain persisted.  Felt somewhat short of breath while walking into building  Does not feel short of breath at rest. .Has been struggling with nausea and vomiting since she ran out of meds.  She denies vaginal bleeding, urinary symptoms, h/a, dizziness, or fever/chills.   Last US revealed SIUP with low heart rate  Abdominal Pain The current episode started today. The pain is located in the LLQ. The quality of the pain is cramping and aching. The abdominal pain does not radiate. Associated symptoms include constipation, nausea and vomiting. Pertinent negatives include no diarrhea, dysuria, fever or frequency. Nothing aggravates the pain. The pain is relieved by Nothing. She has tried nothing for the symptoms.  Emesis  This is a recurrent problem. There has been no fever. Associated symptoms include abdominal pain. Pertinent negatives include no diarrhea or fever. Treatments tried: Ginger. The treatment provided no relief.   RN Note: Robin Lloyd is a 32 y.o. at [redacted]w[redacted]d here in MAU reporting be awaken by a lot of LLQ pain about an hour ago.  Went to [facility] and had a BM - has been constipated. While on toilet got hot and cold and felt like she may pass out. Has been in pain LLQ all wk but worse this am. Pain got worse after BM and was a 10 but now a 7.  Denies VB. Has not felt good for 2 days   Past Medical History:  Diagnosis Date   Anxiety    Chlamydia    GERD (gastroesophageal reflux disease)    Ovarian cyst    Vaginal Pap smear, abnormal    Past Surgical History:  Procedure Laterality Date   DILATION AND CURETTAGE OF UTERUS  2015    Rupture of membranes   Social History   Socioeconomic History   Marital status: Single    Spouse name: Not on file   Number of children: Not on file   Years of education: Not on file   Highest education level: Not on file  Occupational History   Not on file  Tobacco Use   Smoking status: Never   Smokeless tobacco: Never  Vaping Use   Vaping status: Never Used  Substance and Sexual Activity   Alcohol use: No    Comment: Occassionally.   Drug use: No   Sexual activity: Yes    Birth control/protection: None    Comment: Last encounter: 02-27-2023  Other Topics Concern   Not on file  Social History Narrative   Not on file   Social Determinants of Health   Financial Resource Strain: Not on file  Food Insecurity: Not on file  Transportation Needs: Not on file  Physical Activity: Not on file  Stress: Not on file  Social Connections: Not on file  Intimate Partner Violence: Not on file   No current facility-administered medications on file prior to encounter.   Current Outpatient Medications on File Prior to Encounter  Medication Sig Dispense Refill   acetaminophen (TYLENOL) 500 MG tablet Take 1,000 mg by mouth as directed. (Patient not taking: Reported on 03/04/2023)     Iron, Ferrous Sulfate, 325 (65  Fe) MG TABS Take 1 tablet every day by oral route. (Patient not taking: Reported on 03/04/2023)     metroNIDAZOLE (FLAGYL) 500 MG tablet Take 1 tablet (500 mg total) by mouth 2 (two) times daily. 14 tablet 0   Prenatal Vit-Fe Phos-FA-Omega (VITAFOL GUMMIES) 3.33-0.333-34.8 MG CHEW      No Known Allergies  I have reviewed patient's Past Medical Hx, Surgical Hx, Family Hx, Social Hx, medications and allergies.   ROS:  Review of Systems  Constitutional:  Negative for fever.  Gastrointestinal:  Positive for abdominal pain, constipation, nausea and vomiting. Negative for diarrhea.  Genitourinary:  Negative for dysuria and frequency.   Review of Systems  Other systems  negative   Physical Exam  Physical Exam Patient Vitals for the past 24 hrs:  BP Temp Pulse Resp SpO2 Height Weight  03/29/23 0338 114/73 -- -- -- -- -- --  03/29/23 0335 -- 98 F (36.7 C) 73 18 100 % 5\' 2"  (1.575 m) 64.4 kg   Constitutional: Well-developed, well-nourished female in no acute distress.  Cardiovascular: normal rate Respiratory: normal effort GI: Abd soft, non-tender except over LLQ.  No rebound or guarding MS: Extremities nontender, no edema, normal ROM Neurologic: Alert and oriented x 4.  GU: Neg CVAT.  PELVIC EXAM: deferred in lieu of ultrasound.  LAB RESULTS Results for orders placed or performed during the hospital encounter of 03/29/23 (from the past 24 hour(s))  Urinalysis, Routine w reflex microscopic -Urine, Clean Catch     Status: Abnormal   Collection Time: 03/29/23  4:06 AM  Result Value Ref Range   Color, Urine AMBER (A) YELLOW   APPearance HAZY (A) CLEAR   Specific Gravity, Urine 1.026 1.005 - 1.030   pH 5.0 5.0 - 8.0   Glucose, UA NEGATIVE NEGATIVE mg/dL   Hgb urine dipstick NEGATIVE NEGATIVE   Bilirubin Urine NEGATIVE NEGATIVE   Ketones, ur NEGATIVE NEGATIVE mg/dL   Protein, ur NEGATIVE NEGATIVE mg/dL   Nitrite NEGATIVE NEGATIVE   Leukocytes,Ua NEGATIVE NEGATIVE  CBC     Status: Abnormal   Collection Time: 03/29/23  4:39 AM  Result Value Ref Range   WBC 5.1 4.0 - 10.5 K/uL   RBC 3.66 (L) 3.87 - 5.11 MIL/uL   Hemoglobin 10.8 (L) 12.0 - 15.0 g/dL   HCT 72.5 (L) 36.6 - 44.0 %   MCV 87.7 80.0 - 100.0 fL   MCH 29.5 26.0 - 34.0 pg   MCHC 33.6 30.0 - 36.0 g/dL   RDW 34.7 42.5 - 95.6 %   Platelets 225 150 - 400 K/uL   nRBC 0.0 0.0 - 0.2 %  Basic metabolic panel     Status: Abnormal   Collection Time: 03/29/23  4:39 AM  Result Value Ref Range   Sodium 132 (L) 135 - 145 mmol/L   Potassium 3.9 3.5 - 5.1 mmol/L   Chloride 103 98 - 111 mmol/L   CO2 21 (L) 22 - 32 mmol/L   Glucose, Bld 96 70 - 99 mg/dL   BUN 10 6 - 20 mg/dL   Creatinine, Ser  3.87 0.44 - 1.00 mg/dL   Calcium 9.5 8.9 - 56.4 mg/dL   GFR, Estimated >33 >29 mL/min   Anion gap 8 5 - 15     --/--/B POS (09/28 1104)  IMAGING US OB Comp Less 14 Wks  Result Date: 03/29/2023 CLINICAL DATA:  32 year old female with left lower quadrant pain in the 1st trimester of pregnancy. Estimated gestational age by LMP 9 weeks  and 1 day. EXAM: OBSTETRIC <14 WK ULTRASOUND TECHNIQUE: Transabdominal ultrasound was performed for evaluation of the gestation as well as the maternal uterus and adnexal regions. COMPARISON:  03/11/2023 Ob ultrasound. FINDINGS: Intrauterine gestational sac: Single Yolk sac:  Visible Embryo:  Visible Cardiac Activity: Detected Heart Rate: 171 bpm CRL:   17.9 mm   8 w 2 d                  [place] EDC: 11/06/2023 Subchorionic hemorrhage:  None visualized. Maternal uterus/adnexae: No pelvis free fluid. Both ovaries appear normal (left ovary is 2.6 x 3.9 x 2.3 cm, and right ovary is 3.2 x 3.4 x 2.3 cm). IMPRESSION: Single living IUP with estimated gestational age of [redacted] weeks and 2 days by CRL. No acute maternal findings visualized. Electronically Signed   By: Odessa Fleming M.D.   On: 03/29/2023 05:04      MAU Management/MDM: I have reviewed the triage vital signs and the nursing notes.   Pertinent labs & imaging results that were available during my care of the patient were reviewed by me and considered in my medical decision making (see chart for details).      I have reviewed her medical records including past results, notes and treatments. Medical, Surgical, and family history were reviewed.  Medications and recent lab tests were reviewed  Ordered Ultrasound to rule out SAB due to last US showing low heart rate  The one today shows healthy single IUP with good HR  Treatments in MAU included Phenergan and scopolamine.    ASSESSMENT Single IUP at [redacted]w[redacted]d Left lower quadrant abdominal pain Nausea and vomiting  PLAN Discharge home Rx Scopolamine, Phenergan and Zofran for  nausea Warned of constipation r/t zofran.  Rx Miralax for that Followup in office as scheduled Pt stable at time of discharge. Encouraged to return here if she develops worsening of symptoms, increase in pain, fever, or other concerning symptoms.    Wynelle Bourgeois CNM, MSN Certified Nurse-Midwife 03/29/2023  3:50 AM

## 2023-03-29 NOTE — Progress Notes (Signed)
Written and verbal d/c instructions given and understanding voiced. 

## 2023-06-05 NOTE — L&D Delivery Note (Signed)
 Delivery Note Arrived to room to evaluate for cervical change. Patient was found to be complete and +1 station. AROM performed - fluid was noted to be clear. Bed was broken down and with one push, fetal head was delivered. Nuchal cord x 1 was present and reduced at the perineum. Shoulders and body followed without difficulty. Infant was placed on mother's abdomen, mouth and nose were bulb suctioned and let out a vigorous cry. Delayed cord clamping was performed for 60 seconds. Cord clamped and cut by FOB. Venous cord blood was collected. Placenta delivered spontaneously. Cervix, vagina, perineum and labia were inspected and a first degree perineal laceration was noted. The first degree perineal laceration was repaired using 3-0 Vicryl. Uterus fundus firm. TXA 1g IV was given as prophylaxis. Placenta was inspected, found to be intact with a 3 vessel cord. Counts were correct.   At 11:53 PM a viable and healthy female was delivered via Vaginal, Spontaneous (Presentation: ROA).  APGAR: 8, 9; weight: pending Placenta status: Spontaneous, Intact.  Cord: 3 vessels with the following complications: None.  Cord pH: Not collected.  Anesthesia: Epidural Episiotomy: None Lacerations:  First degree perineal Suture Repair: 3.0 vicryl Est. Blood Loss (mL):  100  Mom to postpartum.  Baby to Couplet care / Skin to Skin.  Meldon Sport 10/29/2023, 12:09 AM

## 2023-06-21 ENCOUNTER — Ambulatory Visit: Payer: 59 | Admitting: Cardiology

## 2023-07-05 LAB — OB RESULTS CONSOLE RUBELLA ANTIBODY, IGM: Rubella: IMMUNE

## 2023-07-05 LAB — OB RESULTS CONSOLE HEPATITIS B SURFACE ANTIGEN: Hepatitis B Surface Ag: NEGATIVE

## 2023-07-05 LAB — OB RESULTS CONSOLE RPR: RPR: NONREACTIVE

## 2023-07-05 LAB — OB RESULTS CONSOLE HIV ANTIBODY (ROUTINE TESTING): HIV: NONREACTIVE

## 2023-09-10 ENCOUNTER — Inpatient Hospital Stay (HOSPITAL_COMMUNITY)
Admission: AD | Admit: 2023-09-10 | Discharge: 2023-09-10 | Disposition: A | Discharge status: Home / Self Care | Attending: Obstetrics and Gynecology | Admitting: Obstetrics and Gynecology

## 2023-09-10 ENCOUNTER — Encounter (HOSPITAL_COMMUNITY): Payer: Self-pay | Admitting: Obstetrics and Gynecology

## 2023-09-10 DIAGNOSIS — O23593 Infection of other part of genital tract in pregnancy, third trimester: Secondary | ICD-10-CM | POA: Insufficient documentation

## 2023-09-10 DIAGNOSIS — M5431 Sciatica, right side: Secondary | ICD-10-CM

## 2023-09-10 DIAGNOSIS — N760 Acute vaginitis: Secondary | ICD-10-CM

## 2023-09-10 DIAGNOSIS — O26893 Other specified pregnancy related conditions, third trimester: Secondary | ICD-10-CM | POA: Insufficient documentation

## 2023-09-10 DIAGNOSIS — Z3A32 32 weeks gestation of pregnancy: Secondary | ICD-10-CM

## 2023-09-10 DIAGNOSIS — O99891 Other specified diseases and conditions complicating pregnancy: Secondary | ICD-10-CM | POA: Diagnosis not present

## 2023-09-10 DIAGNOSIS — R102 Pelvic and perineal pain unspecified side: Secondary | ICD-10-CM

## 2023-09-10 DIAGNOSIS — B9689 Other specified bacterial agents as the cause of diseases classified elsewhere: Secondary | ICD-10-CM | POA: Diagnosis not present

## 2023-09-10 DIAGNOSIS — M5432 Sciatica, left side: Secondary | ICD-10-CM | POA: Insufficient documentation

## 2023-09-10 DIAGNOSIS — M549 Dorsalgia, unspecified: Secondary | ICD-10-CM

## 2023-09-10 LAB — WET PREP, GENITAL
Sperm: NONE SEEN
Trich, Wet Prep: NONE SEEN
WBC, Wet Prep HPF POC: <10 (ref <10)
Yeast Wet Prep HPF POC: NONE SEEN

## 2023-09-10 LAB — URINALYSIS, ROUTINE W REFLEX MICROSCOPIC
Bacteria, UA: NONE SEEN
Bilirubin Urine: NEGATIVE
Glucose, UA: NEGATIVE mg/dL
Hgb urine dipstick: NEGATIVE
Ketones, ur: NEGATIVE mg/dL
Nitrite: NEGATIVE
Protein, ur: NEGATIVE mg/dL
Specific Gravity, Urine: 1.008 (ref 1.005–1.030)
pH: 6 (ref 5.0–8.0)

## 2023-09-10 MED ORDER — CYCLOBENZAPRINE HCL 5 MG PO TABS
10.0000 mg | ORAL_TABLET | Freq: Once | ORAL | Status: DC
  Filled 2023-09-10: qty 2

## 2023-09-10 MED ORDER — METHOCARBAMOL 500 MG PO TABS
500.0000 mg | ORAL_TABLET | Freq: Two times a day (BID) | ORAL | 0 refills | Status: AC
Start: 2023-09-10 — End: ?

## 2023-09-10 MED ORDER — ACETAMINOPHEN 500 MG PO TABS
1000.0000 mg | ORAL_TABLET | Freq: Once | ORAL | Status: AC
  Administered 2023-09-10: 1000 mg via ORAL
  Filled 2023-09-10: qty 2

## 2023-09-10 MED ORDER — LIDOCAINE 5 % EX PTCH
1.0000 | MEDICATED_PATCH | CUTANEOUS | 0 refills | Status: AC
Start: 2023-09-10 — End: ?

## 2023-09-10 MED ORDER — METRONIDAZOLE 500 MG PO TABS: 500.0000 mg | ORAL_TABLET | Freq: Two times a day (BID) | ORAL | 0 refills | Status: DC

## 2023-09-10 MED ORDER — NYSTATIN-TRIAMCINOLONE 100000-0.1 UNIT/GM-% EX CREA: TOPICAL_CREAM | CUTANEOUS | 0 refills | Status: DC

## 2023-09-10 NOTE — MAU Provider Note (Signed)
 Chief Complaint:  Vaginal Sweeling   HPI     Robin Lloyd is a 33 y.o. [id number] at [redacted]w[redacted]d who presents to maternity admissions reporting numerous pregnancy complaints that she has reported that have been ongoing x 1 month. Theses include B/L lower abdominal /back pain, B/L lower hip and pelvic pain, with vaginal swelling. She reports that she does not have a belly band and that she has made her primary OB aware of her pregnancy concerns and has an upcoming appointment.   Today she was more concerned with vaginal swelling. Denies any VB, LOF, reports scant vaginal discharge. No CTX's    Pregnancy Course: CCOB  Past Medical History:  Diagnosis Date   Anxiety    Chlamydia    GERD (gastroesophageal reflux disease)    Ovarian cyst    Vaginal Pap smear, abnormal    OB History  Gravida Para Term Preterm AB Living  4 2 2  0 1 2  SAB IAB Ectopic Multiple Live Births     0 2    # Outcome Date GA Lbr Len/2nd Weight Sex Type Anes PTL Lv  4 Current           3 Term 07/30/20 [redacted]w[redacted]d 10:35 / 00:47 3770 g M Vag-Spont EPI  LIV  2 Term 04/24/18 [redacted]w[redacted]d 03:34 / 00:31 3646 g F Vag-Spont EPI  LIV  1 AB 06/09/14 [redacted]w[redacted]d    SAB   FD     Complications: Preterm premature rupture of membranes (PPROM) with unknown onset of labor    Obstetric Comments  Premature rupture at 5 months.   Past Surgical History:  Procedure Laterality Date   DILATION AND CURETTAGE OF UTERUS  2015   Rupture of membranes   History reviewed. No pertinent family history. Social History   Tobacco Use   Smoking status: Never   Smokeless tobacco: Never  Vaping Use   Vaping status: Never Used  Substance Use Topics   Alcohol use: No    Comment: Occassionally.   Drug use: No   No Known Allergies Medications Prior to Admission  Medication Sig Dispense Refill Last Dose/Taking   Prenatal Vit-Fe Phos-FA-Omega (VITAFOL GUMMIES) 3.33-0.333-34.8 MG CHEW    09/09/2023   acetaminophen (TYLENOL) 500 MG tablet Take 1,000 mg by mouth as  directed. (Patient not taking: Reported on 03/04/2023)      doxylamine, Sleep, (UNISOM) 25 MG tablet Take 25 mg by mouth at bedtime as needed.      Iron, Ferrous Sulfate, 325 (65 Fe) MG TABS Take 1 tablet every day by oral route. (Patient not taking: Reported on 03/04/2023)      ondansetron (ZOFRAN-ODT) 4 MG disintegrating tablet Take 1 tablet (4 mg total) by mouth every 6 (six) hours as needed for nausea. 20 tablet 0    promethazine (PHENERGAN) 25 MG tablet Take 1 tablet (25 mg total) by mouth every 6 (six) hours as needed for nausea or vomiting. 30 tablet 2    pyridOXINE (VITAMIN B6) 100 MG tablet Take 100 mg by mouth daily.      scopolamine (TRANSDERM-SCOP) 1 MG/3DAYS Place 1 patch (1.5 mg total) onto the skin every 3 (three) days. 10 patch 12     I have reviewed patient's Past Medical Hx, Surgical Hx, Family Hx, Social Hx, medications and allergies.   ROS  Pertinent items noted in HPI and remainder of comprehensive ROS otherwise negative.   PHYSICAL EXAM  Patient Vitals for the past 24 hrs:  BP Temp Temp src Pulse Resp SpO2  Height Weight  09/10/23 1112 101/69 -- -- 90 -- -- -- --  09/10/23 1051 101/65 98.4 F (36.9 C) Oral 95 16 100 % 5\' 2"  (1.575 m) 78.6 kg    Constitutional: Well-developed, well-nourished female in no acute distress.  Cardiovascular: normal rate & rhythm, warm and well-perfused Respiratory: normal effort, no problems with respiration noted, O2 Sat at 100% GI: Abd soft, non-tender, gravid MS: Extremities nontender, no edema, normal ROM Neurologic: Alert and oriented x 4.  GU: no CVA tenderness Pelvic: Mildly erythremic and edematous vulvar/perineum,  no vaginal lesions visualized, No discharge visualized, swabs sent  SVE: Cervical exam deferred d/t no ctx's     Fetal Tracing: Cat 1, reactive @ 1216 Baseline: 135 Variability: moderate  Accelerations: present Decelerations: absent Toco: quite   Labs: Results for orders placed or performed during the hospital  encounter of 09/10/23 (from the past 24 hours)  Urinalysis, Routine w reflex microscopic -Urine, Clean Catch     Status: Abnormal   Collection Time: 09/10/23 11:17 AM  Result Value Ref Range   Color, Urine YELLOW YELLOW   APPearance HAZY (A) CLEAR   Specific Gravity, Urine 1.008 1.005 - 1.030   pH 6.0 5.0 - 8.0   Glucose, UA NEGATIVE NEGATIVE mg/dL   Hgb urine dipstick NEGATIVE NEGATIVE   Bilirubin Urine NEGATIVE NEGATIVE   Ketones, ur NEGATIVE NEGATIVE mg/dL   Protein, ur NEGATIVE NEGATIVE mg/dL   Nitrite NEGATIVE NEGATIVE   Leukocytes,Ua TRACE (A) NEGATIVE   RBC / HPF 0-5 0 - 5 RBC/hpf   WBC, UA 0-5 0 - 5 WBC/hpf   Bacteria, UA NONE SEEN NONE SEEN   Squamous Epithelial / HPF 6-10 0 - 5 /HPF   Mucus PRESENT   Wet prep, genital     Status: Abnormal   Collection Time: 09/10/23 11:59 AM  Result Value Ref Range   Yeast Wet Prep HPF POC NONE SEEN NONE SEEN   Trich, Wet Prep NONE SEEN NONE SEEN   Clue Cells Wet Prep HPF POC PRESENT (A) NONE SEEN   WBC, Wet Prep HPF POC <10 <10   Sperm NONE SEEN      MDM & MAU COURSE  MDM:  HIGH   MAU Course: Orders Placed This Encounter  Procedures   Wet prep, genital   Urinalysis, Routine w reflex microscopic -Urine, Clean Catch   Discharge patient Discharge disposition: 01-Home or Self Care; Discharge patient date: 09/10/2023   Meds ordered this encounter  Medications   acetaminophen (TYLENOL) tablet 1,000 mg   DISCONTD: cyclobenzaprine (FLEXERIL) tablet 10 mg   methocarbamol (ROBAXIN) 500 MG tablet    Sig: Take 1 tablet (500 mg total) by mouth 2 (two) times daily.    Dispense:  20 tablet    Refill:  0    Supervising Provider:   Reva Bores [[id number]]   lidocaine (LIDODERM) 5 %    Sig: Place 1 patch onto the skin daily. Remove & Discard patch within 12 hours or as directed by MD    Dispense:  30 patch    Refill:  0    Supervising Provider:   Reva Bores [[id number]]   metroNIDAZOLE (FLAGYL) 500 MG tablet    Sig: Take 1 tablet (500 mg  total) by mouth 2 (two) times daily.    Dispense:  14 tablet    Refill:  0    Supervising Provider:   Reva Bores [[id number]]   nystatin-triamcinolone (MYCOLOG II) cream    Sig: Apply to  affected area daily    Dispense:  15 g    Refill:  0    Supervising Provider:   Samara Snide    I have reviewed the patient chart and performed the physical exam . I have ordered & interpreted the lab results and reviewed and interpreted the NST Medications ordered as stated below.  A/P as described below.  Counseling and education provided and patient agreeable  with plan as described below. Verbalized understanding.    ASSESSMENT   1. Vulvar pain   2. Bilateral sciatica   3. Back pain affecting pregnancy   4. [redacted] weeks gestation of pregnancy   5. Bacterial vaginosis     PLAN  Discharge home in stable condition with return precautions.   See AVS for full description of information given to the patient including both verbal and written. Patient verbalized understanding and agrees with the plan as described above.      Allergies as of 09/10/2023   No Known Allergies      Medication List     TAKE these medications    acetaminophen 500 MG tablet Commonly known as: TYLENOL Take 1,000 mg by mouth as directed.   doxylamine (Sleep) 25 MG tablet Commonly known as: UNISOM Take 25 mg by mouth at bedtime as needed.   Iron (Ferrous Sulfate) 325 (65 Fe) MG Tabs Take 1 tablet every day by oral route.   lidocaine 5 % Commonly known as: Lidoderm Place 1 patch onto the skin daily. Remove & Discard patch within 12 hours or as directed by MD   methocarbamol 500 MG tablet Commonly known as: ROBAXIN Take 1 tablet (500 mg total) by mouth 2 (two) times daily.   metroNIDAZOLE 500 MG tablet Commonly known as: FLAGYL Take 1 tablet (500 mg total) by mouth 2 (two) times daily.   nystatin-triamcinolone cream Commonly known as: MYCOLOG II Apply to affected area daily   ondansetron 4 MG  disintegrating tablet Commonly known as: ZOFRAN-ODT Take 1 tablet (4 mg total) by mouth every 6 (six) hours as needed for nausea.   promethazine 25 MG tablet Commonly known as: PHENERGAN Take 1 tablet (25 mg total) by mouth every 6 (six) hours as needed for nausea or vomiting.   pyridOXINE 100 MG tablet Commonly known as: VITAMIN B6 Take 100 mg by mouth daily.   Transderm-Scop 1 MG/3DAYS Generic drug: scopolamine Place 1 patch (1.5 mg total) onto the skin every 3 (three) days.   Vitafol Gummies 3.33-0.333-34.8 MG Chew       Marcell Barlow, MSN, [facility], [facility] [employer]

## 2023-09-10 NOTE — MAU Note (Signed)
 Flexeril not given because pt has to drive herself home.

## 2023-09-10 NOTE — Discharge Instructions (Signed)

## 2023-09-10 NOTE — MAU Note (Signed)
.  Robin Lloyd is a 33 y.o. at [redacted]w[redacted]d here in MAU reporting: Lower abdominal pain, pelvic pain, left hip pain, and vaginal swelling for over one month now that became worse four days now. She reports an increase in clear/white vaginal discharge. She also reports an increase in shortness of breath with activity and if she lays flat on her back. Denies vaginal itching and vaginal odors. Denies IC. Denies VB. +FM.  Drove herself here.  Onset of complaint: One plus month, worsened four days ago  Pain score: 7/10 abdomen, pelvis, left hip, and vagina  Vitals:   09/10/23 1051  BP: 101/65  Pulse: 95  Resp: 16  Temp: 98.4 F (36.9 C)  SpO2: 100%     FHT: 135 initial external Lab orders placed from triage: UA

## 2023-09-11 LAB — GC/CHLAMYDIA PROBE AMP (~~LOC~~) NOT AT ARMC
Chlamydia: NEGATIVE
Comment: NEGATIVE
Comment: NORMAL
Neisseria Gonorrhea: NEGATIVE

## 2023-09-14 ENCOUNTER — Other Ambulatory Visit: Payer: Self-pay

## 2023-09-14 ENCOUNTER — Other Ambulatory Visit (HOSPITAL_COMMUNITY): Payer: Self-pay

## 2023-09-14 ENCOUNTER — Emergency Department (HOSPITAL_COMMUNITY)
Admission: EM | Admit: 2023-09-14 | Discharge: 2023-09-14 | Disposition: A | Discharge status: Home / Self Care | Attending: Emergency Medicine | Admitting: Emergency Medicine

## 2023-09-14 DIAGNOSIS — K644 Residual hemorrhoidal skin tags: Secondary | ICD-10-CM | POA: Diagnosis not present

## 2023-09-14 DIAGNOSIS — Z3A33 33 weeks gestation of pregnancy: Secondary | ICD-10-CM | POA: Insufficient documentation

## 2023-09-14 DIAGNOSIS — O26893 Other specified pregnancy related conditions, third trimester: Secondary | ICD-10-CM | POA: Insufficient documentation

## 2023-09-14 MED ORDER — NITROGLYCERIN 2 % TD OINT
1.0000 [in_us] | TOPICAL_OINTMENT | Freq: Once | TRANSDERMAL | Status: AC
  Administered 2023-09-14: 1 [in_us] via TOPICAL
  Filled 2023-09-14: qty 2

## 2023-09-14 MED ORDER — NITROGLYCERIN NICU 2% OINTMENT
1.0000 | TOPICAL_OINTMENT | Freq: Three times a day (TID) | TRANSDERMAL | 0 refills | Status: DC
Start: 2023-09-14 — End: 2023-09-14
  Filled 2023-09-14: qty 30, 10d supply, fill #0

## 2023-09-14 MED ORDER — "NITROGLYCERIN NICU 2% OINTMENT ": 1.0000 | TOPICAL_OINTMENT | Freq: Three times a day (TID) | TRANSDERMAL | 0 refills | Status: AC

## 2023-09-14 NOTE — ED Provider Notes (Signed)
 [place] EMERGENCY DEPARTMENT AT [facility] Provider Note   CSN: [id number] Arrival date & time: 09/14/23  0940     History  Chief Complaint  Patient presents with   Hemorrhoids    Robin Lloyd is a 33 y.o. female currently [redacted] weeks pregnant presents today for hemorrhoids x 5 days.  Patient endorses pain and itching.  Patient denies bleeding or constipation.  Patient states that she has tried sitz bath's, Preparation H, witch hazel, and lidocaine gel without relief.  HPI     Home Medications Prior to Admission medications   Medication Sig Start Date End Date Taking? Authorizing Provider  nitroGLYCERIN (NITROGLYN) 2 % OINT ointment Apply 1 Application topically 3 (three) times daily for 7 days. 09/14/23 09/21/23 Yes Tayler Lassen N, PA-C  acetaminophen (TYLENOL) 500 MG tablet Take 1,000 mg by mouth as directed. Patient not taking: Reported on 03/04/2023    [provider]  doxylamine, Sleep, (UNISOM) 25 MG tablet Take 25 mg by mouth at bedtime as needed.    [provider]  Iron, Ferrous Sulfate, 325 (65 Fe) MG TABS Take 1 tablet every day by oral route. Patient not taking: Reported on 03/04/2023 05/12/20   [provider]  lidocaine (LIDODERM) 5 % Place 1 patch onto the skin daily. Remove & Discard patch within 12 hours or as directed by MD 09/10/23   Cherlynn Cornfield, NP  methocarbamol (ROBAXIN) 500 MG tablet Take 1 tablet (500 mg total) by mouth 2 (two) times daily. 09/10/23   Cooleen, Darren Em, NP  metroNIDAZOLE (FLAGYL) 500 MG tablet Take 1 tablet (500 mg total) by mouth 2 (two) times daily. 09/10/23   Cherlynn Cornfield, NP  nystatin-triamcinolone (MYCOLOG II) cream Apply to affected area daily 09/10/23   Cooleen, Darren Em, NP  ondansetron (ZOFRAN-ODT) 4 MG disintegrating tablet Take 1 tablet (4 mg total) by mouth every 6 (six) hours as needed for nausea. 03/29/23   Harlee Lichtenstein, CNM  Prenatal Vit-Fe Phos-FA-Omega (VITAFOL GUMMIES) 3.33-0.333-34.8  MG CHEW     [provider]  promethazine (PHENERGAN) 25 MG tablet Take 1 tablet (25 mg total) by mouth every 6 (six) hours as needed for nausea or vomiting. 03/29/23   Harlee Lichtenstein, CNM  pyridOXINE (VITAMIN B6) 100 MG tablet Take 100 mg by mouth daily.    [provider]  scopolamine (TRANSDERM-SCOP) 1 MG/3DAYS Place 1 patch (1.5 mg total) onto the skin every 3 (three) days. 03/29/23   Harlee Lichtenstein, CNM      Allergies    Patient has no known allergies.    Review of Systems   Review of Systems  Gastrointestinal:        Hemorrhoid  All other systems reviewed and are negative.   Physical Exam Updated Vital Signs BP 120/82 (BP Location: Left Arm)   Pulse (!) 120   Temp 98.1 F (36.7 C) (Oral)   Resp 16   Ht 5\' 2"  (1.575 m)   Wt 78.5 kg   LMP 01/24/2023 (Exact Date)   SpO2 100%   BMI 31.64 kg/m  Physical Exam Vitals and nursing note reviewed. Exam conducted with a chaperone present.  Constitutional:      General: She is not in acute distress.    Appearance: Normal appearance. She is well-developed. She is not ill-appearing, toxic-appearing or diaphoretic.  HENT:     Head: Normocephalic and atraumatic.     Right Ear: External ear normal.     Left Ear:  External ear normal.     Nose: Nose normal.     Mouth/Throat:     Mouth: Mucous membranes are moist.  Eyes:     Conjunctiva/sclera: Conjunctivae normal.  Cardiovascular:     Rate and Rhythm: Normal rate and regular rhythm.  Pulmonary:     Effort: Pulmonary effort is normal. No respiratory distress.  Abdominal:     Palpations: Abdomen is soft.  Genitourinary:    Rectum: External hemorrhoid present.       Comments: Singular large nonthrombosed external hemorrhoid at the 12 o'clock position without bleeding noted Musculoskeletal:        General: No swelling.     Cervical back: Neck supple.  Skin:    General: Skin is warm and dry.     Capillary Refill: Capillary refill takes less than 2  seconds.  Neurological:     General: No focal deficit present.     Mental Status: She is alert.  Psychiatric:        Mood and Affect: Mood normal.     ED Results / Procedures / Treatments   Labs (all labs ordered are listed, but only abnormal results are displayed) Labs Reviewed - No data to display  EKG None  Radiology No results found.  Procedures Procedures    Medications Ordered in ED Medications  nitroGLYCERIN (NITROGLYN) 2 % ointment 1 inch (has no administration in time range)    ED Course/ Medical Decision Making/ A&P                                 Medical Decision Making  This patient presents to the ED for concern of hemorrhoid differential diagnosis includes thrombosed hemorrhoid, external hemorrhoid, internal hemorrhoid, anal fissure    Medicines ordered and prescription drug management:  I ordered medication including nitroglycerin ointment for hemorrhoid Reevaluation of the patient after these medicines showed that the patient stayed the same I have reviewed the patients home medicines and have made adjustments as needed   Consulted with MAU APP [facility] who suggested nitroglycerin ointment and referral to general surgery for further evaluation and possible treatment.  Considered for admission or further workup however patient's vital signs and physical exam are reassuring.  Patient given outpatient nitroglycerin ointment for hemorrhoid treatment and advised to contact OB/GYN for referral to general surgery if her symptoms persist for further evaluation and treatment.          Final Clinical Impression(s) / ED Diagnoses Final diagnoses:  External hemorrhoids    Rx / DC Orders ED Discharge Orders          Ordered    nitroGLYCERIN (NITROGLYN) 2 % OINT ointment  3 times daily        09/14/23 1046              Carie Charity, PA-C 09/14/23 1047    Tegeler, Marine Sia, MD 09/14/23 1553

## 2023-09-14 NOTE — Discharge Instructions (Signed)
 Today you are seen for a hemorrhoid.  If your symptoms persist please call your OB/GYN Monday so that they can refer you to general surgery for further evaluation and treatment.  Please pick up your medication and use as prescribed.  Thank you for letting us  treat you today. After performing a physical exam, I feel you are safe to go home. Please follow up with your PCP in the next several days and provide them with your records from this visit. Return to the Emergency Room if pain becomes severe or symptoms worsen.

## 2023-09-14 NOTE — ED Triage Notes (Signed)
 Patient to ED by POV with c/o hemorrhoids. Per patient she has a hemorrhoids x 4 days. Area is red swollen. Currently [redacted] weeks pregnant.

## 2023-10-20 ENCOUNTER — Encounter (HOSPITAL_COMMUNITY): Payer: Self-pay | Admitting: Obstetrics and Gynecology

## 2023-10-20 ENCOUNTER — Inpatient Hospital Stay (HOSPITAL_COMMUNITY)
Admission: AD | Admit: 2023-10-20 | Discharge: 2023-10-20 | Disposition: A | Discharge status: Home / Self Care | Attending: Obstetrics and Gynecology | Admitting: Obstetrics and Gynecology

## 2023-10-20 ENCOUNTER — Other Ambulatory Visit: Payer: Self-pay

## 2023-10-20 DIAGNOSIS — O26893 Other specified pregnancy related conditions, third trimester: Secondary | ICD-10-CM | POA: Diagnosis not present

## 2023-10-20 DIAGNOSIS — Z3A38 38 weeks gestation of pregnancy: Secondary | ICD-10-CM

## 2023-10-20 DIAGNOSIS — O23593 Infection of other part of genital tract in pregnancy, third trimester: Secondary | ICD-10-CM | POA: Insufficient documentation

## 2023-10-20 DIAGNOSIS — Z0371 Encounter for suspected problem with amniotic cavity and membrane ruled out: Secondary | ICD-10-CM

## 2023-10-20 DIAGNOSIS — O479 False labor, unspecified: Secondary | ICD-10-CM

## 2023-10-20 DIAGNOSIS — O471 False labor at or after 37 completed weeks of gestation: Secondary | ICD-10-CM | POA: Diagnosis present

## 2023-10-20 DIAGNOSIS — N760 Acute vaginitis: Secondary | ICD-10-CM | POA: Diagnosis not present

## 2023-10-20 DIAGNOSIS — B9689 Other specified bacterial agents as the cause of diseases classified elsewhere: Secondary | ICD-10-CM

## 2023-10-20 LAB — WET PREP, GENITAL
Sperm: NONE SEEN
Trich, Wet Prep: NONE SEEN
WBC, Wet Prep HPF POC: >=10 — AB (ref <10)
Yeast Wet Prep HPF POC: NONE SEEN

## 2023-10-20 LAB — POCT FERN TEST
POCT Fern Test: NEGATIVE
POCT Fern Test: NEGATIVE

## 2023-10-20 MED ORDER — METRONIDAZOLE 500 MG PO TABS
500.0000 mg | ORAL_TABLET | Freq: Two times a day (BID) | ORAL | 0 refills | Status: AC
Start: 2023-10-20 — End: ?

## 2023-10-20 NOTE — MAU Provider Note (Signed)
 Chief Complaint  Patient presents with   Contractions   Rupture of Membranes     Event Date/Time   First Provider Initiated Contact with Patient 10/20/23 2103      S: Robin Lloyd  is a 33 y.o. y.o. year old [id number] female at [redacted]w[redacted]d weeks gestation who presents to MAU reporting gush of thick clear fluid since 1900 and worsening contractions.  Reports cervix was 2.5 at last office visit.    Contractions: mild-moderate  Vaginal bleeding: Denies Fetal movement: Scant  O: Patient Vitals for the past 24 hrs:  BP Temp Temp src Pulse Resp SpO2  10/20/23 2040 -- -- -- -- -- 100 %  10/20/23 2033 111/66 -- -- 94 -- 100 %  10/20/23 2025 122/67 98.1 F (36.7 C) Oral 91 16 --   General: NAD Heart: Regular rate Lungs: Normal rate and effort Abd: Soft, NT, Gravid, S=D Pelvic: NEFG, neg pooling, no blood. Scant physiologic discharge  Dilation: 3 Effacement (%): 60 Station: -2 Presentation: Vertex Exam by:: Hulda Mage, RN  EFM: 135, Moderate variability, 15 x 15 accelerations, no decelerations Toco: Q3- 9, mild-mod  Neg Fern x 2 Cervix unchanged after one hour.  A: [redacted]w[redacted]d week IUP No evidence of SROM or active labor FHR reactive  P: Discharge home in stable condition. Labor precautions and fetal kick counts. Follow-up as scheduled for prenatal visit or sooner as needed if symptoms worsen. Return to maternity admissions as needed if symptoms worsen.  [name] , CNM 10/20/2023 9:03 PM  2

## 2023-10-20 NOTE — MAU Note (Signed)
 Robin Lloyd is a 33 y.o. at [redacted]w[redacted]d here in MAU reporting: ctxs that started about 0200 this morning. States that contraction are about 5 min apart. Reports that she was walking around about an hour ago when she had a gush of fluid that ran down her legs. Reports fluid was clear. Reports light vaginal bleeding that started morning after cervical exam. She was 2.5 cm at that time. +FM.   Onset of complaint: 0200 this morning Pain score: 7/10 There were no vitals filed for this visit.   FHT: 150  Lab orders placed from triage: urine and fern

## 2023-10-28 ENCOUNTER — Encounter (HOSPITAL_COMMUNITY): Payer: Self-pay | Admitting: Obstetrics and Gynecology

## 2023-10-28 ENCOUNTER — Inpatient Hospital Stay (HOSPITAL_COMMUNITY): Admitting: Anesthesiology

## 2023-10-28 ENCOUNTER — Inpatient Hospital Stay (HOSPITAL_COMMUNITY)
Admission: AD | Admit: 2023-10-28 | Discharge: 2023-10-30 | DRG: 807 | Disposition: A | Discharge status: Home / Self Care | Payer: Self-pay | Attending: Obstetrics and Gynecology | Admitting: Obstetrics and Gynecology

## 2023-10-28 DIAGNOSIS — K219 Gastro-esophageal reflux disease without esophagitis: Secondary | ICD-10-CM | POA: Diagnosis present

## 2023-10-28 DIAGNOSIS — O26893 Other specified pregnancy related conditions, third trimester: Secondary | ICD-10-CM | POA: Diagnosis present

## 2023-10-28 DIAGNOSIS — O9902 Anemia complicating childbirth: Secondary | ICD-10-CM | POA: Diagnosis present

## 2023-10-28 DIAGNOSIS — O9962 Diseases of the digestive system complicating childbirth: Secondary | ICD-10-CM | POA: Diagnosis present

## 2023-10-28 DIAGNOSIS — Z3A39 39 weeks gestation of pregnancy: Secondary | ICD-10-CM

## 2023-10-28 DIAGNOSIS — O99824 Streptococcus B carrier state complicating childbirth: Secondary | ICD-10-CM | POA: Diagnosis present

## 2023-10-28 LAB — CBC
HCT: 31.6 % — ABNORMAL LOW (ref 36.0–46.0)
Hemoglobin: 10 g/dL — ABNORMAL LOW (ref 12.0–15.0)
MCH: 25.9 pg — ABNORMAL LOW (ref 26.0–34.0)
MCHC: 31.6 g/dL (ref 30.0–36.0)
MCV: 81.9 fL (ref 80.0–100.0)
Platelets: 216 10*3/uL (ref 150–400)
RBC: 3.86 MIL/uL — ABNORMAL LOW (ref 3.87–5.11)
RDW: 15.9 % — ABNORMAL HIGH (ref 11.5–15.5)
WBC: 7.4 10*3/uL (ref 4.0–10.5)
nRBC: 0 % (ref 0.0–0.2)

## 2023-10-28 LAB — HIV ANTIBODY (ROUTINE TESTING W REFLEX): HIV Screen 4th Generation wRfx: NONREACTIVE

## 2023-10-28 LAB — TYPE AND SCREEN
ABO/RH(D): B POS
Antibody Screen: NEGATIVE

## 2023-10-28 LAB — POCT FERN TEST: POCT Fern Test: NEGATIVE

## 2023-10-28 MED ORDER — SODIUM CHLORIDE 0.9 % IV SOLN
5.0000 10*6.[IU] | Freq: Once | INTRAVENOUS | Status: AC
  Administered 2023-10-28: 5 10*6.[IU] via INTRAVENOUS
  Filled 2023-10-28: qty 5

## 2023-10-28 MED ORDER — OXYCODONE-ACETAMINOPHEN 5-325 MG PO TABS: 2.0000 | ORAL_TABLET | ORAL | Status: DC | PRN

## 2023-10-28 MED ORDER — ONDANSETRON HCL 4 MG/2ML IJ SOLN: 4.0000 mg | Freq: Four times a day (QID) | INTRAMUSCULAR | Status: DC | PRN

## 2023-10-28 MED ORDER — OXYTOCIN-SODIUM CHLORIDE 30-0.9 UT/500ML-% IV SOLN
1.0000 m[IU]/min | INTRAVENOUS | Status: DC
  Administered 2023-10-28: 2 m[IU]/min via INTRAVENOUS
  Filled 2023-10-28: qty 500

## 2023-10-28 MED ORDER — PHENYLEPHRINE 80 MCG/ML (10ML) SYRINGE FOR IV PUSH (FOR BLOOD PRESSURE SUPPORT): 80.0000 ug | PREFILLED_SYRINGE | INTRAVENOUS | Status: DC | PRN

## 2023-10-28 MED ORDER — LIDOCAINE HCL (PF) 1 % IJ SOLN
INTRAMUSCULAR | Status: DC | PRN
  Administered 2023-10-28 (×2): 4 mL via EPIDURAL

## 2023-10-28 MED ORDER — LACTATED RINGERS IV SOLN: 500.0000 mL | INTRAVENOUS | Status: DC | PRN

## 2023-10-28 MED ORDER — OXYTOCIN-SODIUM CHLORIDE 30-0.9 UT/500ML-% IV SOLN: 2.5000 [IU]/h | INTRAVENOUS | Status: DC

## 2023-10-28 MED ORDER — TERBUTALINE SULFATE 1 MG/ML IJ SOLN: 0.2500 mg | Freq: Once | INTRAMUSCULAR | Status: DC | PRN

## 2023-10-28 MED ORDER — EPHEDRINE 5 MG/ML INJ: 10.0000 mg | INTRAVENOUS | Status: DC | PRN

## 2023-10-28 MED ORDER — LIDOCAINE HCL (PF) 1 % IJ SOLN: 30.0000 mL | INTRAMUSCULAR | Status: DC | PRN

## 2023-10-28 MED ORDER — HYDROXYZINE HCL 50 MG PO TABS: 50.0000 mg | ORAL_TABLET | Freq: Four times a day (QID) | ORAL | Status: DC | PRN

## 2023-10-28 MED ORDER — OXYTOCIN BOLUS FROM INFUSION
333.0000 mL | Freq: Once | INTRAVENOUS | Status: AC
  Administered 2023-10-28: 333 mL via INTRAVENOUS

## 2023-10-28 MED ORDER — DIPHENHYDRAMINE HCL 50 MG/ML IJ SOLN: 12.5000 mg | INTRAMUSCULAR | Status: DC | PRN

## 2023-10-28 MED ORDER — OXYCODONE-ACETAMINOPHEN 5-325 MG PO TABS: 1.0000 | ORAL_TABLET | ORAL | Status: DC | PRN

## 2023-10-28 MED ORDER — PENICILLIN G POT IN DEXTROSE 60000 UNIT/ML IV SOLN
3.0000 10*6.[IU] | INTRAVENOUS | Status: DC
  Administered 2023-10-28: 3 10*6.[IU] via INTRAVENOUS
  Filled 2023-10-28: qty 50

## 2023-10-28 MED ORDER — LACTATED RINGERS IV SOLN: 500.0000 mL | Freq: Once | INTRAVENOUS | Status: DC

## 2023-10-28 MED ORDER — ACETAMINOPHEN 325 MG PO TABS: 650.0000 mg | ORAL_TABLET | ORAL | Status: DC | PRN

## 2023-10-28 MED ORDER — LACTATED RINGERS IV SOLN: INTRAVENOUS | Status: DC

## 2023-10-28 MED ORDER — FENTANYL-BUPIVACAINE-NACL 0.5-0.125-0.9 MG/250ML-% EP SOLN
12.0000 mL/h | EPIDURAL | Status: DC | PRN
  Administered 2023-10-28: 12 mL/h via EPIDURAL
  Filled 2023-10-28: qty 250

## 2023-10-28 MED ORDER — SOD CITRATE-CITRIC ACID 500-334 MG/5ML PO SOLN: 30.0000 mL | ORAL | Status: DC | PRN

## 2023-10-28 MED ORDER — TRANEXAMIC ACID-NACL 1000-0.7 MG/100ML-% IV SOLN
INTRAVENOUS | Status: AC
  Administered 2023-10-29: 1000 mg
  Filled 2023-10-28: qty 100

## 2023-10-28 MED ORDER — FENTANYL CITRATE (PF) 100 MCG/2ML IJ SOLN
50.0000 ug | INTRAMUSCULAR | Status: DC | PRN
Start: 2023-10-28 — End: 2023-10-29

## 2023-10-28 NOTE — MAU Note (Signed)
..  Robin Lloyd is a 33 y.o. at [redacted]w[redacted]d here in MAU reporting: back to back contractions that started 1 hour ago. She states she also started leaking clear fluid around the same time (1700). Denies VB. +FM. Wants epidural. GBS pos.   Pain score: 10 Vitals:   10/28/23 1754  BP: 108/70  Pulse: 92  Resp: 14  Temp: 97.8 F (36.6 C)  SpO2: 100%     FHT:144 Lab orders placed from triage:   mau labor

## 2023-10-28 NOTE — Anesthesia Preprocedure Evaluation (Signed)
 Anesthesia Evaluation  Patient identified by MRN, date of birth, ID band Patient awake    Reviewed: Allergy & Precautions, NPO status , Patient's Chart, lab work & pertinent test results  History of Anesthesia Complications Negative for: history of anesthetic complications  Airway Mallampati: III  TM Distance: >3 FB Neck ROM: Full    Dental  (+) Dental Advisory Given   Pulmonary neg pulmonary ROS   Pulmonary exam normal breath sounds clear to auscultation       Cardiovascular negative cardio ROS  Rhythm:Regular Rate:Normal     Neuro/Psych Seizures - (h/o eclampsia with previous pregnancy),  PSYCHIATRIC DISORDERS Anxiety        GI/Hepatic Neg liver ROS,GERD  ,,  Endo/Other  negative endocrine ROS    Renal/GU negative Renal ROS     Musculoskeletal   Abdominal   Peds  Hematology  (+) Blood dyscrasia, anemia Lab Results      Component                Value               Date                      WBC                      7.4                 10/28/2023                HGB                      10.0 (L)            10/28/2023                HCT                      31.6 (L)            10/28/2023                MCV                      81.9                10/28/2023                PLT                      216                 10/28/2023              Anesthesia Other Findings   Reproductive/Obstetrics (+) Pregnancy                             Anesthesia Physical Anesthesia Plan  ASA: 2  Anesthesia Plan: Epidural   Post-op Pain Management:    Induction:   PONV Risk Score and Plan:   Airway Management Planned: Natural Airway  Additional Equipment:   Intra-op Plan:   Post-operative Plan:   Informed Consent: I have reviewed the patients History and Physical, chart, labs and discussed the procedure including the risks, benefits and alternatives for the proposed anesthesia with the patient  or authorized representative who has indicated his/her understanding and acceptance.  Plan Discussed with: Anesthesiologist  Anesthesia Plan Comments: (I have discussed risks of neuraxial anesthesia including but not limited to infection, bleeding, nerve injury, back pain, headache, seizures, and failure of block. Patient denies bleeding disorders and is not currently anticoagulated. Labs have been reviewed. Risks and benefits discussed. All patient's questions answered.  )       Anesthesia Quick Evaluation

## 2023-10-28 NOTE — H&P (Signed)
 HPI: 33 y.o. [id number] @ [redacted]w[redacted]d estimated gestational age (as dated by 8 week ultrasound) presents for contractions. Reports contractions every 2-3 minutes, painful enough to desire epidural. Paint is comfortable with epidural now. Open to Pitocin  if needed for augmentation of labor.  Leakage of fluid:  No Vaginal bleeding:  No Contractions:  Yes Fetal movement:  Yes  Prenatal care has been provided by [facility]  ROS:  Denies fevers, chills, chest pain, visual changes, SOB, RUQ/epigastric pain, N/V, dysuria, hematuria, or sudden onset/worsening bilateral LE or facial edema.  Pregnancy complicated by: Anxiety GERD Abnormal pap smear (colposcopic impression CIN I, no biopsies performed) Hx postpartum preeclampsia  Prenatal Transfer Tool  Maternal Diabetes: No Genetic Screening: Declined Maternal Ultrasounds/Referrals: Normal Fetal Ultrasounds or other Referrals:  None Maternal Substance Abuse:  No Significant Maternal Medications:  None Significant Maternal Lab Results: Group B Strep positive   Prenatal Labs Blood type:  B Positive Antibody screen:  Negative CBC:  H/H 11/33 Rubella: Immune RPR:  Non-reactive Hep B:  Negative Hep C:  Negative HIV:  Negative GC/CT:  Negative Glucola:  Normal   OBHx:  OB History     Gravida  4   Para  2   Term  2   Preterm  0   AB  1   Living  2      SAB      IAB      Ectopic      Multiple  0   Live Births  2        Obstetric Comments  Premature rupture at 5 months.        PMHx:  See above Meds:  PNV Allergy:  No Known Allergies SurgHx:  Past Surgical History:  Procedure Laterality Date   DILATION AND CURETTAGE OF UTERUS  2015   Rupture of membranes   SocHx:   Denies Tobacco, ETOH, illicit drugs  O: BP 111/74 (BP Location: Right Arm)   Pulse 90   Temp 97.7 F (36.5 C) (Oral)   Resp 19   LMP 01/24/2023 (Exact Date)   SpO2 100%  Gen. AAOx3, NAD CV.  RRR  Resp. CTAB, no  wheezes/rales/rhonchi Abd. Gravid, soft, non-tender throughout, no rebound/guarding Extr.  No bilateral LE edema, no calf tenderness bilaterally SVE: 5/60/-2, vertex by primary RN at 2015    FHT: 135 baseline, moderate variability, + accels,  - decels Toco: q 2-3 min   Labs: see orders  A/P:  33 y.o. [id number] @ [redacted]w[redacted]d who is admitted for normal labor.  - Admit to L&D - Admit labs (CBC, T&S, RPR) - CEFM/Toco - FWB:  Category I - Diet:  Clear liquids - IVF:  Per protocol - VTE Prophylaxis:  SCDs - GBS Status:  Positive -- PCN started at 1746 - Presentation:  Vertex by cervical exam - Pain control:  Epidural - Pitocin  for augmentation of labor PRN - Anticipate SVD - Will return for AROM after 4h of PCN   Quinteria Chisum, DO

## 2023-10-28 NOTE — Anesthesia Procedure Notes (Signed)
 Epidural Patient location during procedure: OB Start time: 10/28/2023 7:28 PM End time: 10/28/2023 7:33 PM  Staffing Anesthesiologist: Conard Decent, MD Performed: anesthesiologist   Preanesthetic Checklist Completed: patient identified, IV checked, site marked, risks and benefits discussed, surgical consent, monitors and equipment checked, pre-op evaluation and timeout performed  Epidural Patient position: sitting Prep: DuraPrep and site prepped and draped Patient monitoring: continuous pulse ox and blood pressure Approach: midline Location: L3-L4 Injection technique: LOR saline  Needle:  Needle type: Tuohy  Needle gauge: 17 G Needle length: 9 cm and 9 Needle insertion depth: 5.5 cm Catheter type: closed end flexible Catheter size: 19 Gauge Catheter at skin depth: 10.5 cm Test dose: negative  Assessment Events: blood not aspirated, no cerebrospinal fluid, injection not painful, no injection resistance, no paresthesia and negative IV test  Additional Notes The patient has requested an epidural for labor pain management. Risks and benefits including, but not limited to, infection, bleeding, local anesthetic toxicity, headache, hypotension, back pain, block failure, etc. were discussed with the patient. The patient expressed understanding and consented to the procedure. I confirmed that the patient has no bleeding disorders and is not taking blood thinners. I confirmed the patient's last platelet count with the nurse. A time-out was performed immediately prior to the procedure. Please see nursing documentation for vital signs. Sterile technique was used throughout the whole procedure. Once LOR achieved, the epidural catheter threaded easily without resistance. Aspiration of the catheter was negative for blood and CSF. The epidural was dosed slowly and an infusion was started.  1 attempt(s)Reason for block:procedure for pain

## 2023-10-29 ENCOUNTER — Other Ambulatory Visit: Payer: Self-pay

## 2023-10-29 ENCOUNTER — Encounter (HOSPITAL_COMMUNITY): Payer: Self-pay | Admitting: Obstetrics and Gynecology

## 2023-10-29 LAB — CBC
HCT: 30.3 % — ABNORMAL LOW (ref 36.0–46.0)
Hemoglobin: 9.4 g/dL — ABNORMAL LOW (ref 12.0–15.0)
MCH: 25.5 pg — ABNORMAL LOW (ref 26.0–34.0)
MCHC: 31 g/dL (ref 30.0–36.0)
MCV: 82.1 fL (ref 80.0–100.0)
Platelets: 177 10*3/uL (ref 150–400)
RBC: 3.69 MIL/uL — ABNORMAL LOW (ref 3.87–5.11)
RDW: 15.9 % — ABNORMAL HIGH (ref 11.5–15.5)
WBC: 8.4 10*3/uL (ref 4.0–10.5)
nRBC: 0 % (ref 0.0–0.2)

## 2023-10-29 LAB — RPR: RPR Ser Ql: NONREACTIVE

## 2023-10-29 MED ORDER — PRENATAL MULTIVITAMIN CH
1.0000 | ORAL_TABLET | Freq: Every day | ORAL | Status: DC
  Administered 2023-10-29 – 2023-10-30 (×2): 1 via ORAL
  Filled 2023-10-29 (×2): qty 1

## 2023-10-29 MED ORDER — SIMETHICONE 80 MG PO CHEW: 80.0000 mg | CHEWABLE_TABLET | ORAL | Status: DC | PRN

## 2023-10-29 MED ORDER — DIBUCAINE (PERIANAL) 1 % EX OINT: 1.0000 | TOPICAL_OINTMENT | CUTANEOUS | Status: DC | PRN

## 2023-10-29 MED ORDER — ACETAMINOPHEN 325 MG PO TABS: 650.0000 mg | ORAL_TABLET | ORAL | Status: DC | PRN

## 2023-10-29 MED ORDER — TETANUS-DIPHTH-ACELL PERTUSSIS 5-2.5-18.5 LF-MCG/0.5 IM SUSY: 0.5000 mL | PREFILLED_SYRINGE | Freq: Once | INTRAMUSCULAR | Status: DC

## 2023-10-29 MED ORDER — ZOLPIDEM TARTRATE 5 MG PO TABS: 5.0000 mg | ORAL_TABLET | Freq: Every evening | ORAL | Status: DC | PRN

## 2023-10-29 MED ORDER — COCONUT OIL OIL: 1.0000 | TOPICAL_OIL | Status: DC | PRN

## 2023-10-29 MED ORDER — WITCH HAZEL-GLYCERIN EX PADS: 1.0000 | MEDICATED_PAD | CUTANEOUS | Status: DC | PRN

## 2023-10-29 MED ORDER — ONDANSETRON HCL 4 MG PO TABS: 4.0000 mg | ORAL_TABLET | ORAL | Status: DC | PRN

## 2023-10-29 MED ORDER — SENNOSIDES-DOCUSATE SODIUM 8.6-50 MG PO TABS
2.0000 | ORAL_TABLET | Freq: Every day | ORAL | Status: DC
  Administered 2023-10-30: 2 via ORAL
  Filled 2023-10-29 (×2): qty 2

## 2023-10-29 MED ORDER — OXYCODONE HCL 5 MG PO TABS: 5.0000 mg | ORAL_TABLET | ORAL | Status: DC | PRN

## 2023-10-29 MED ORDER — OXYCODONE HCL 5 MG PO TABS: 10.0000 mg | ORAL_TABLET | ORAL | Status: DC | PRN

## 2023-10-29 MED ORDER — TRANEXAMIC ACID-NACL 1000-0.7 MG/100ML-% IV SOLN: 1000.0000 mg | INTRAVENOUS | Status: DC

## 2023-10-29 MED ORDER — IBUPROFEN 600 MG PO TABS
600.0000 mg | ORAL_TABLET | Freq: Four times a day (QID) | ORAL | Status: DC
  Administered 2023-10-29 – 2023-10-30 (×6): 600 mg via ORAL
  Filled 2023-10-29 (×6): qty 1

## 2023-10-29 MED ORDER — ONDANSETRON HCL 4 MG/2ML IJ SOLN: 4.0000 mg | INTRAMUSCULAR | Status: DC | PRN

## 2023-10-29 MED ORDER — DIPHENHYDRAMINE HCL 25 MG PO CAPS: 25.0000 mg | ORAL_CAPSULE | Freq: Four times a day (QID) | ORAL | Status: DC | PRN

## 2023-10-29 MED ORDER — BENZOCAINE-MENTHOL 20-0.5 % EX AERO
1.0000 | INHALATION_SPRAY | CUTANEOUS | Status: DC | PRN
  Administered 2023-10-30: 1 via TOPICAL
  Filled 2023-10-29: qty 56

## 2023-10-29 NOTE — Social Work (Signed)
 MOB was referred for history of depression/anxiety.  * Referral screened out by Clinical Social Worker because none of the following criteria appear to apply:  ~ History of anxiety/depression during this pregnancy, or of post-partum depression following prior delivery.  ~ Diagnosis of anxiety and/or depression within last 3 years OR * MOB's symptoms currently being treated with medication and/or therapy.  Per chart review MOB's diagnosis dates back to 2016, per OB records no noted MH concerns during this pregnancy. Edinburgh=5  Please contact the Clinical Social Worker if needs arise, or by MOB request.   Haroldine Likens, LCSWA Clinical Social Worker [phone]

## 2023-10-29 NOTE — Progress Notes (Addendum)
 Post Partum Day 1 Subjective: no complaints, up ad lib, voiding, tolerating PO, and bottle feeding  Objective: Blood pressure 112/78, pulse 85, temperature 98.4 F (36.9 C), temperature source Oral, resp. rate 18, height 5\' 2"  (1.575 m), weight 78.5 kg, last menstrual period 01/24/2023, SpO2 99%, unknown if currently breastfeeding.  Physical Exam:  General: alert, cooperative, and no distress Lochia: appropriate Uterine Fundus: FF, NT Incision: n/a DVT Evaluation: no calf tenderness  Recent Labs    10/28/23 1838 10/29/23 0441  HGB 10.0* 9.4*  HCT 31.6* 30.3*    Assessment/Plan: Plan for discharge tomorrow and Circumcision prior to discharge Cont routine PP care   LOS: 1 day   Madelene Schanz, MD 10/29/2023, 2:36 PM

## 2023-10-29 NOTE — Anesthesia Postprocedure Evaluation (Signed)
 Anesthesia Post Note  Patient: Robin Lloyd  Procedure(s) Performed: AN AD HOC LABOR EPIDURAL     Patient location during evaluation: Mother Baby Anesthesia Type: Epidural Level of consciousness: awake, oriented and awake and alert Pain management: pain level controlled Vital Signs Assessment: post-procedure vital signs reviewed and stable Respiratory status: spontaneous breathing, nonlabored ventilation and respiratory function stable Cardiovascular status: stable Postop Assessment: no headache, patient able to bend at knees, adequate PO intake, able to ambulate and no apparent nausea or vomiting Anesthetic complications: no   No notable events documented.  Last Vitals:  Vitals:   10/29/23 0316 10/29/23 0715  BP: 114/82 99/73  Pulse: 71 67  Resp: 16 18  Temp: 36.7 C 36.7 C  SpO2: 100%     Last Pain:  Vitals:   10/29/23 0715  TempSrc: Oral  PainSc: 0-No pain   Pain Goal:                   Catrinia Racicot

## 2023-10-29 NOTE — Plan of Care (Signed)
  Problem: Education: Goal: Knowledge of Childbirth will improve Outcome: Progressing Goal: Ability to make informed decisions regarding treatment and plan of care will improve Outcome: Progressing Goal: Ability to state and carry out methods to decrease the pain will improve Outcome: Progressing Goal: Individualized Educational Video(s) Outcome: Progressing   Problem: Coping: Goal: Ability to verbalize concerns and feelings about labor and delivery will improve Outcome: Progressing   Problem: Life Cycle: Goal: Ability to make normal progression through stages of labor will improve Outcome: Progressing Goal: Ability to effectively push during vaginal delivery will improve Outcome: Progressing   Problem: Role Relationship: Goal: Will demonstrate positive interactions with the child Outcome: Progressing   Problem: Safety: Goal: Risk of complications during labor and delivery will decrease Outcome: Progressing   Problem: Pain Management: Goal: Relief or control of pain from uterine contractions will improve Outcome: Progressing   Problem: Education: Goal: Knowledge of General Education information will improve Description: Including pain rating scale, medication(s)/side effects and non-pharmacologic comfort measures Outcome: Progressing   Problem: Health Behavior/Discharge Planning: Goal: Ability to manage health-related needs will improve Outcome: Progressing   Problem: Clinical Measurements: Goal: Ability to maintain clinical measurements within normal limits will improve Outcome: Progressing Goal: Will remain free from infection Outcome: Progressing Goal: Diagnostic test results will improve Outcome: Progressing Goal: Respiratory complications will improve Outcome: Progressing Goal: Cardiovascular complication will be avoided Outcome: Progressing   Problem: Activity: Goal: Risk for activity intolerance will decrease Outcome: Progressing   Problem:  Nutrition: Goal: Adequate nutrition will be maintained Outcome: Progressing   Problem: Coping: Goal: Level of anxiety will decrease Outcome: Progressing   Problem: Elimination: Goal: Will not experience complications related to bowel motility Outcome: Progressing Goal: Will not experience complications related to urinary retention Outcome: Progressing   Problem: Pain Managment: Goal: General experience of comfort will improve and/or be controlled Outcome: Progressing   Problem: Safety: Goal: Ability to remain free from injury will improve Outcome: Progressing   Problem: Skin Integrity: Goal: Risk for impaired skin integrity will decrease Outcome: Progressing   Problem: Education: Goal: Knowledge of condition will improve Outcome: Progressing Goal: Individualized Educational Video(s) Outcome: Progressing Goal: Individualized Newborn Educational Video(s) Outcome: Progressing   Problem: Activity: Goal: Will verbalize the importance of balancing activity with adequate rest periods Outcome: Progressing Goal: Ability to tolerate increased activity will improve Outcome: Progressing   Problem: Coping: Goal: Ability to identify and utilize available resources and services will improve Outcome: Progressing   Problem: Life Cycle: Goal: Chance of risk for complications during the postpartum period will decrease Outcome: Progressing   Problem: Role Relationship: Goal: Ability to demonstrate positive interaction with newborn will improve Outcome: Progressing   Problem: Skin Integrity: Goal: Demonstration of wound healing without infection will improve Outcome: Progressing

## 2023-10-29 NOTE — Lactation Note (Signed)
 This note was copied from a baby's chart. Lactation Consultation Note  Patient Name: Robin Lloyd [id number] Date: 10/29/2023 Age:34 hours  Mom chooses to formula feed.   Maternal Data    Feeding Nipple Type: Slow - flow  LATCH Score                    Lactation Tools Discussed/Used    Interventions    Discharge    Consult Status Consult Status: Complete    Ren Grasse G 10/29/2023, 6:28 AM

## 2023-10-30 MED ORDER — PRENATAL MULTIVITAMIN CH: 1.0000 | ORAL_TABLET | Freq: Every day | ORAL | 1 refills | Status: AC

## 2023-10-30 MED ORDER — BENZOCAINE-MENTHOL 20-0.5 % EX AERO: 1.0000 | INHALATION_SPRAY | CUTANEOUS | 1 refills | Status: AC | PRN

## 2023-10-30 MED ORDER — IBUPROFEN 600 MG PO TABS: 600.0000 mg | ORAL_TABLET | Freq: Four times a day (QID) | ORAL | 0 refills | Status: AC

## 2023-10-30 NOTE — Discharge Summary (Signed)
 Postpartum Discharge Summary  Date of Service updated5/28/25     Patient Name: Robin Lloyd DOB: 08/16/1990 MRN: [id number]  Date of admission: 10/28/2023 Delivery date:10/28/2023 Delivering provider: Meldon Sport Date of discharge: 10/30/2023  Admitting diagnosis: Normal labor [O80, Z37.9] Intrauterine pregnancy: [redacted]w[redacted]d     Secondary diagnosis:  Principal Problem:   Normal labor  Additional problems: none    Discharge diagnosis: Term Pregnancy Delivered                                              Post partum procedures:  Augmentation: AROM Complications: None  Hospital course: Onset of Labor With Vaginal Delivery      33 y.o. yo [id number] at [redacted]w[redacted]d was admitted in Active Labor on 10/28/2023. Labor course was complicated by  Membrane Rupture Time/Date: 11:47 PM,10/29/2023  Delivery Method:Vaginal, Spontaneous Operative Delivery:N/A Episiotomy: None Lacerations:  1st degree;Perineal Patient had a postpartum course complicated by .  She is ambulating, tolerating a regular diet, passing flatus, and urinating well. Patient is discharged home in stable condition on 10/30/23.  Newborn Data: Birth date:10/28/2023 Birth time:11:53 PM Gender:Female Living status:Living Apgars:8 ,9  Weight:3600 g  Magnesium  Sulfate received: No BMZ received: No Rhophylac:No MMR:No T-DaP:Given postpartum Flu: No RSV Vaccine received: No Transfusion:No Immunizations administered: There is no immunization history for the selected administration types on file for this patient.  Physical exam  Vitals:   10/29/23 1115 10/29/23 1515 10/29/23 2100 10/30/23 0548  BP: 112/78 104/77 117/86 106/81  Pulse: 85 72 (!) 111 64  Resp: 18 18 17 17   Temp: 98.4 F (36.9 C) 98 F (36.7 C)  98 F (36.7 C)  TempSrc: Oral Oral  Oral  SpO2: 99% 100% 95% 95%  Weight:      Height:       General: alert and cooperative Lochia: appropriate Uterine Fundus: firm Incision: N/A DVT Evaluation: No evidence  of DVT seen on physical exam. Labs: Lab Results  Component Value Date   WBC 8.4 10/29/2023   HGB 9.4 (L) 10/29/2023   HCT 30.3 (L) 10/29/2023   MCV 82.1 10/29/2023   PLT 177 10/29/2023      Latest Ref Rng & Units 03/29/2023    4:39 AM  CMP  Glucose 70 - 99 mg/dL 96   BUN 6 - 20 mg/dL 10   Creatinine 2.13 - 1.00 mg/dL 0.86   Sodium 578 - 469 mmol/L 132   Potassium 3.5 - 5.1 mmol/L 3.9   Chloride 98 - 111 mmol/L 103   CO2 22 - 32 mmol/L 21   Calcium  8.9 - 10.3 mg/dL 9.5    Edinburgh Score:    10/29/2023   11:15 AM  Edinburgh Postnatal Depression Scale Screening Tool  I have been able to laugh and see the funny side of things. 0  I have looked forward with enjoyment to things. 0  I have blamed myself unnecessarily when things went wrong. 1  I have been anxious or worried for no good reason. 2  I have felt scared or panicky for no good reason. 1  Things have been getting on top of me. 0  I have been so unhappy that I have had difficulty sleeping. 0  I have felt sad or miserable. 1  I have been so unhappy that I have been crying. 0  The thought of harming myself has  occurred to me. 0  Edinburgh Postnatal Depression Scale Total 5      After visit meds:  Allergies as of 10/30/2023   No Known Allergies      Medication List     TAKE these medications    acetaminophen  500 MG tablet Commonly known as: TYLENOL  Take 1,000 mg by mouth as directed.   benzocaine -Menthol  20-0.5 % Aero Commonly known as: DERMOPLAST Apply 1 Application topically as needed for irritation (perineal discomfort).   doxylamine (Sleep) 25 MG tablet Commonly known as: UNISOM Take 25 mg by mouth at bedtime as needed.   ibuprofen  600 MG tablet Commonly known as: ADVIL  Take 1 tablet (600 mg total) by mouth every 6 (six) hours.   lidocaine  5 % Commonly known as: Lidoderm  Place 1 patch onto the skin daily. Remove & Discard patch within 12 hours or as directed by MD   methocarbamol  500 MG  tablet Commonly known as: ROBAXIN  Take 1 tablet (500 mg total) by mouth 2 (two) times daily.   metroNIDAZOLE  500 MG tablet Commonly known as: FLAGYL  Take 1 tablet (500 mg total) by mouth 2 (two) times daily.   nitroGLYCERIN  2 % Oint ointment Commonly known as: NITROGLYN Apply 1 Application topically 3 (three) times daily for 7 days.   ondansetron  4 MG disintegrating tablet Commonly known as: ZOFRAN -ODT Take 1 tablet (4 mg total) by mouth every 6 (six) hours as needed for nausea.   prenatal multivitamin Tabs tablet Take 1 tablet by mouth daily at 12 noon.   promethazine  25 MG tablet Commonly known as: PHENERGAN  Take 1 tablet (25 mg total) by mouth every 6 (six) hours as needed for nausea or vomiting.   pyridOXINE 100 MG tablet Commonly known as: VITAMIN B6 Take 100 mg by mouth daily.   Transderm-Scop 1 MG/3DAYS Generic drug: scopolamine  Place 1 patch (1.5 mg total) onto the skin every 3 (three) days.   Vitafol Gummies 3.33-0.333-34.8 MG Chew         Discharge home in stable condition Infant Feeding: Bottle Infant Disposition:home with mother Discharge instruction: per After Visit Summary and Postpartum booklet. Activity: Advance as tolerated. Pelvic rest for 6 weeks.  Diet: routine diet Anticipated Birth Control: Unsure Postpartum Appointment:1 week Additional Postpartum F/U: Postpartum Depression checkup Future Appointments:No future appointments. Follow up Visit:  Follow-up Information     [facility] Follow up in 1 week(s).   Specialty: Obstetrics and Gynecology Contact information: [address] [place] [facility] [place]  [phone]                    10/30/2023 Norville Beery, MD

## 2023-11-07 ENCOUNTER — Telehealth (HOSPITAL_COMMUNITY): Payer: Self-pay | Admitting: *Deleted

## 2023-11-07 NOTE — Telephone Encounter (Signed)
 11/07/2023  Name: Robin Lloyd MRN: [id number] DOB: 1990-08-03  Reason for Call:  Transition of Care Hospital Discharge Call  Contact Status: Patient Contact Status: Complete  Language assistant needed: Interpreter Mode: Interpreter Not Needed        Follow-Up Questions: Do You Have Any Concerns About Your Health As You Heal From Delivery?: No Do You Have Any Concerns About Your Infants Health?: No  Edinburgh Postnatal Depression Scale:  In the Past 7 Days: I have been able to laugh and see the funny side of things.: As much as I always could I have looked forward with enjoyment to things.: As much as I ever did I have blamed myself unnecessarily when things went wrong.: No, never I have been anxious or worried for no good reason.: No, not at all I have felt scared or panicky for no good reason.: No, not at all Things have been getting on top of me.: No, I have been coping as well as ever I have been so unhappy that I have had difficulty sleeping.: Not at all I have felt sad or miserable.: No, not at all I have been so unhappy that I have been crying.: No, never The thought of harming myself has occurred to me.: Never Edinburgh Postnatal Depression Scale Total: 0  PHQ2-9 Depression Scale:     Discharge Follow-up: Edinburgh score requires follow up?: No Patient was advised of the following resources:: Support Group  Post-discharge interventions: Reviewed Newborn Safe Sleep Practices  Pearlie Bougie, RN 11/07/2023 14:40

## 2024-04-03 ENCOUNTER — Other Ambulatory Visit: Payer: Self-pay | Admitting: Obstetrics and Gynecology

## 2024-04-08 LAB — SURGICAL PATHOLOGY

## 2024-06-30 ENCOUNTER — Other Ambulatory Visit: Payer: Self-pay | Admitting: Obstetrics and Gynecology

## 2024-07-27 ENCOUNTER — Ambulatory Visit (HOSPITAL_COMMUNITY): Admit: 2024-07-27 | Admitting: Obstetrics and Gynecology
